# Patient Record
Sex: Female | Born: 1960 | Race: White | Hispanic: No | Marital: Single | Smoking: Current every day smoker
Health system: Southern US, Community
[De-identification: ages and names within clinical notes are randomized; demographics above are authoritative.]

## PROBLEM LIST (undated history)

## (undated) DIAGNOSIS — E079 Disorder of thyroid, unspecified: Secondary | ICD-10-CM

## (undated) DIAGNOSIS — Z803 Family history of malignant neoplasm of breast: Secondary | ICD-10-CM

## (undated) DIAGNOSIS — Z972 Presence of dental prosthetic device (complete) (partial): Secondary | ICD-10-CM

## (undated) DIAGNOSIS — T8859XA Other complications of anesthesia, initial encounter: Secondary | ICD-10-CM

## (undated) DIAGNOSIS — Z1379 Encounter for other screening for genetic and chromosomal anomalies: Secondary | ICD-10-CM

## (undated) DIAGNOSIS — M8580 Other specified disorders of bone density and structure, unspecified site: Secondary | ICD-10-CM

## (undated) DIAGNOSIS — N809 Endometriosis, unspecified: Secondary | ICD-10-CM

## (undated) DIAGNOSIS — M1990 Unspecified osteoarthritis, unspecified site: Secondary | ICD-10-CM

## (undated) DIAGNOSIS — R200 Anesthesia of skin: Secondary | ICD-10-CM

## (undated) DIAGNOSIS — Z9289 Personal history of other medical treatment: Secondary | ICD-10-CM

## (undated) DIAGNOSIS — T4145XA Adverse effect of unspecified anesthetic, initial encounter: Secondary | ICD-10-CM

## (undated) DIAGNOSIS — R202 Paresthesia of skin: Secondary | ICD-10-CM

## (undated) HISTORY — DX: Endometriosis, unspecified: N80.9

## (undated) HISTORY — DX: Family history of malignant neoplasm of breast: Z80.3

## (undated) HISTORY — DX: Disorder of thyroid, unspecified: E07.9

## (undated) HISTORY — DX: Other specified disorders of bone density and structure, unspecified site: M85.80

## (undated) HISTORY — PX: ABDOMINAL HYSTERECTOMY: SHX81

## (undated) HISTORY — PX: KNEE SURGERY: SHX244

## (undated) HISTORY — DX: Encounter for other screening for genetic and chromosomal anomalies: Z13.79

## (undated) HISTORY — DX: Personal history of other medical treatment: Z92.89

---

## 1989-10-01 HISTORY — PX: TUBAL LIGATION: SHX77

## 2006-03-27 ENCOUNTER — Ambulatory Visit: Payer: Self-pay | Admitting: Family Medicine

## 2006-10-17 ENCOUNTER — Ambulatory Visit: Payer: Self-pay

## 2009-02-25 ENCOUNTER — Ambulatory Visit: Payer: Self-pay | Admitting: Unknown Physician Specialty

## 2010-03-04 ENCOUNTER — Ambulatory Visit: Payer: Self-pay | Admitting: Unknown Physician Specialty

## 2010-03-25 ENCOUNTER — Ambulatory Visit: Payer: Self-pay | Admitting: Unknown Physician Specialty

## 2010-05-19 ENCOUNTER — Ambulatory Visit: Payer: Self-pay | Admitting: Unknown Physician Specialty

## 2010-05-21 ENCOUNTER — Ambulatory Visit: Payer: Self-pay | Admitting: General Surgery

## 2010-05-21 HISTORY — PX: COLONOSCOPY: SHX174

## 2010-05-25 LAB — PATHOLOGY REPORT

## 2011-08-31 ENCOUNTER — Ambulatory Visit: Payer: Self-pay | Admitting: Unknown Physician Specialty

## 2012-12-03 ENCOUNTER — Ambulatory Visit: Payer: Self-pay | Admitting: Orthopedic Surgery

## 2013-03-14 ENCOUNTER — Ambulatory Visit: Payer: Self-pay | Admitting: Orthopedic Surgery

## 2013-04-25 ENCOUNTER — Encounter: Payer: Self-pay | Admitting: *Deleted

## 2015-01-30 NOTE — Op Note (Signed)
PATIENT NAME:  Julie Boyer, Julie Boyer MR#:  [id number] DATE OF BIRTH:  1961/09/16  DATE OF PROCEDURE:  03/15/2013  PREOPERATIVE DIAGNOSES:  1.  Right knee osteoarthritis.  2.  Medial and lateral meniscus tears.  POSTOPERATIVE DIAGNOSES:   1.  Right knee osteoarthritis.  2.  Medial and lateral meniscus tears with loose body.   PROCEDURES: 1. Arthroscopy, right knee.  2.  Removal of loose body.  3.  Partial medial and lateral meniscectomy.   ANESTHESIA:  General.   SURGEON: Laurene Footman, MD  DESCRIPTION OF PROCEDURE: The patient was brought to the operating room and after adequate anesthesia was obtained, the right leg was placed in the arthroscopic legholder with a tourniquet applied, but not required. After prepping and draping in the usual sterile fashion, appropriate patient identification and timeout procedures were completed. There were prior arthroscopy portals and these were utilized inferomedial and inferolateral. Initial inspection revealed significant partial thickness loss to the entire undersurface of the patella as well as partial thickness cartilage loss in the femoral trochlea. Coming around medially, the inferomedial portal was opened and a probe introduced. There was a parrot-beak type tear at the junction of the middle and posterior thirds of the meniscus with the meniscus displaceable into the joint upon probing. There are grade 2 changes to the femoral and tibial condyles, but no exposed bone. The anterior cruciate ligament appeared intact with slight laxity. On examination of the lateral compartment, there was exposed bone on both femoral and tibial condyles especially posteriorly. The posterior meniscus had a complex tear involving most of the thickness of the meniscus. There was also loose body compartment, which was removed at this time, approximately 1 cm x 0.5 cm. The meniscal punch was used to debride the meniscus tears, followed by a shaver and then an ArthroCare wand to  get back to a stable margin, resecting approximately a quarter of the medial posterior horn and three quarters of the posterior horn of the lateral meniscus to get to a stable margin. The gutters were free of any loose bodies. The knee was irrigated until clear. All instrumentation was withdrawn. A single 4-0 nylon sutures were placed in each portal, 20 mL of 0.5% Sensorcaine was infiltrated into the area of the portals to aid in postoperative analgesia. Xeroform, 4 x 4's, Webril and Ace wrap were applied. The patient was sent to the recovery room in stable condition.   ESTIMATED BLOOD LOSS: Minimal.   COMPLICATIONS: None.   SPECIMEN: None.  Pre- and postprocedure pictures obtained.   ____________________________ Laurene Footman, MD mjm:cc D: 03/14/2013 21:55:27 ET T: 03/15/2013 00:10:23 ET JOB#: [id number]  cc: Laurene Footman, MD, <Dictator> Laurene Footman MD ELECTRONICALLY SIGNED 03/15/2013 19:09

## 2015-03-18 LAB — HM PAP SMEAR: HM PAP: NEGATIVE

## 2015-03-18 LAB — HM MAMMOGRAPHY

## 2015-04-02 ENCOUNTER — Other Ambulatory Visit: Payer: Self-pay

## 2015-04-02 ENCOUNTER — Ambulatory Visit (INDEPENDENT_AMBULATORY_CARE_PROVIDER_SITE_OTHER): Payer: BLUE CROSS/BLUE SHIELD | Admitting: Physician Assistant

## 2015-04-02 ENCOUNTER — Encounter: Payer: Self-pay | Admitting: Physician Assistant

## 2015-04-02 VITALS — BP 106/60 | HR 66 | Temp 98.2°F | Resp 16 | Ht 63.0 in | Wt 127.4 lb

## 2015-04-02 DIAGNOSIS — J349 Unspecified disorder of nose and nasal sinuses: Secondary | ICD-10-CM

## 2015-04-02 DIAGNOSIS — E538 Deficiency of other specified B group vitamins: Secondary | ICD-10-CM | POA: Diagnosis not present

## 2015-04-02 DIAGNOSIS — Z1211 Encounter for screening for malignant neoplasm of colon: Secondary | ICD-10-CM

## 2015-04-02 DIAGNOSIS — Z1389 Encounter for screening for other disorder: Secondary | ICD-10-CM | POA: Diagnosis not present

## 2015-04-02 DIAGNOSIS — Z0000 Encounter for general adult medical examination without abnormal findings: Secondary | ICD-10-CM

## 2015-04-02 DIAGNOSIS — Z720 Tobacco use: Secondary | ICD-10-CM | POA: Diagnosis not present

## 2015-04-02 DIAGNOSIS — Z1331 Encounter for screening for depression: Secondary | ICD-10-CM

## 2015-04-02 DIAGNOSIS — R5383 Other fatigue: Secondary | ICD-10-CM

## 2015-04-02 DIAGNOSIS — Z833 Family history of diabetes mellitus: Secondary | ICD-10-CM | POA: Diagnosis not present

## 2015-04-02 DIAGNOSIS — Z23 Encounter for immunization: Secondary | ICD-10-CM | POA: Diagnosis not present

## 2015-04-02 DIAGNOSIS — E039 Hypothyroidism, unspecified: Secondary | ICD-10-CM

## 2015-04-02 MED ORDER — BUPROPION HCL ER (SR) 150 MG PO TB12: 150.0000 mg | ORAL_TABLET | Freq: Two times a day (BID) | ORAL | Status: DC

## 2015-04-02 NOTE — Progress Notes (Signed)
Patient ID: Julie Boyer, female   DOB: [birth year], 54 y.o.   MRN: [id number]   Patient: Julie Boyer, Female    DOB: 1961/03/22, 54 y.o.   MRN: [id number] Visit Date: 04/02/2015  Today's Provider: Mar Daring, PA-C   Chief Complaint  Patient presents with  . Establish Care   Subjective:    Annual physical exam Julie Boyer is a 54 y.o. female who presents today for health maintenance and complete physical. She feels fairly well. She reports not exercising. She reports she is sleeping poorly, stating she awakes every couple of hours.  She lost her job last year and her brother passed last year as well from cancer.  She does have hypothyroidism and is followed by Dr. Sabino Niemann.  She also recently had her pap smear and mammogram 2 weeks ago.  The pap is self reported normal.  She has not received the mammogram result yet.  She also was tested for BRCA 1 and BRCA 2.  She has not received these results either.  She does report increased fatigue and just not feeling like herself since around May 2016.  Her TSH was checked in April and was WNL.    -----------------------------------------------------------------   Review of Systems  Constitutional: Positive for activity change and fatigue. Negative for fever, chills, appetite change and unexpected weight change.  HENT: Positive for congestion, postnasal drip, rhinorrhea, sinus pressure and sneezing. Negative for ear discharge, ear pain, hearing loss, mouth sores, sore throat, tinnitus, trouble swallowing and voice change.   Eyes: Negative for photophobia, pain, redness, itching and visual disturbance.  Respiratory: Negative for apnea, cough, chest tightness, shortness of breath and wheezing.   Cardiovascular: Negative for chest pain, palpitations and leg swelling.  Gastrointestinal: Positive for constipation. Negative for nausea, vomiting, abdominal pain, diarrhea, blood in stool and abdominal distention.  Endocrine: Positive  for polydipsia. Negative for cold intolerance, heat intolerance, polyphagia and polyuria.  Genitourinary: Negative for dysuria, frequency, hematuria, flank pain, vaginal bleeding, vaginal discharge, difficulty urinating, vaginal pain, menstrual problem and pelvic pain.  Musculoskeletal: Negative for myalgias, back pain, joint swelling, arthralgias, gait problem, neck pain and neck stiffness.  Skin: Negative for color change and rash.  Allergic/Immunologic: Positive for environmental allergies. Negative for food allergies and immunocompromised state.  Neurological: Positive for headaches. Negative for dizziness, tremors, seizures, syncope, facial asymmetry, speech difficulty, weakness, light-headedness and numbness.  Hematological: Negative for adenopathy. Does not bruise/bleed easily.  Psychiatric/Behavioral: Positive for decreased concentration. Negative for suicidal ideas, hallucinations, behavioral problems, confusion, sleep disturbance, self-injury, dysphoric mood and agitation. The patient is not nervous/anxious and is not hyperactive.     Social History She  reports that she has been smoking.  She has never used smokeless tobacco. She reports that she drinks alcohol. She reports that she does not use illicit drugs.  Patient Active Problem List   Diagnosis Date Noted  . Hypothyroidism 04/02/2015  . Sinus trouble 04/02/2015  . Fatigue 04/02/2015    Past Surgical History  Procedure Laterality Date  . Abdominal hysterectomy  1992  . Knee surgery Right 2011    Family History Her family history includes Alzheimer's disease in her mother; Bone cancer in her father; Breast cancer in her sister; Cancer in her brother; Diabetes in her mother; Lung cancer in her brother; Prostate cancer in her father.    Previous Medications   BIOTIN FORTE PO    Take by mouth daily.   CETIRIZINE (ZYRTEC) 10 MG TABLET    Take  1 tablet by mouth daily.   CYANOCOBALAMIN (VITAMIN B-12 PO)    Take by mouth  daily.   ESTRADIOL (ESTRACE) 0.5 MG TABLET    Take 1 tablet by mouth daily.   MISC NATURAL PRODUCTS (OSTEO BI-FLEX ADV DOUBLE ST PO)    Take by mouth daily.   MOMETASONE (NASONEX) 50 MCG/ACT NASAL SPRAY    Place 1 spray into the nose daily.   SYNTHROID 100 MCG TABLET    Take 1 tablet by mouth daily.    Patient Care Team: Mar Daring, PA-C as PCP - General (Physician Assistant)     Objective:   Vitals: BP 106/60 mmHg  Pulse 66  Temp(Src) 98.2 F (36.8 C) (Oral)  Resp 16  Ht _0  (1.6 m)  Wt 127 lb 6.4 oz (57.788 kg)  BMI 22.57 kg/m2   Physical Exam  Constitutional: She is oriented to person, place, and time. She appears well-developed and well-nourished. No distress.  HENT:  Head: Normocephalic and atraumatic.  Right Ear: Hearing, external ear and ear canal normal. A middle ear effusion (clear) is present.  Left Ear: Hearing, external ear and ear canal normal. A middle ear effusion (clear) is present.  Nose: Mucosal edema (left > right) and rhinorrhea present. No sinus tenderness. Right sinus exhibits maxillary sinus tenderness. Right sinus exhibits no frontal sinus tenderness. Left sinus exhibits maxillary sinus tenderness and frontal sinus tenderness.  Mouth/Throat: Uvula is midline, oropharynx is clear and moist and mucous membranes are normal. No oropharyngeal exudate.  Eyes: Conjunctivae are normal. Pupils are equal, round, and reactive to light. Right eye exhibits no discharge. Left eye exhibits no discharge. No scleral icterus.  Neck: Normal range of motion. Neck supple. Carotid bruit is not present. No tracheal deviation present. No thyromegaly present.  Cardiovascular: Normal rate, regular rhythm, normal heart sounds and intact distal pulses.  Exam reveals no gallop and no friction rub.   No murmur heard. Pulmonary/Chest: Effort normal and breath sounds normal. No respiratory distress. She has no wheezes. She has no rales. She exhibits no tenderness.  Abdominal:  Soft. Bowel sounds are normal. She exhibits no distension and no mass. There is no tenderness. There is no rebound and no guarding.  Genitourinary:  Deferred and completed 2 weeks ago by OB/GYN  Musculoskeletal: Normal range of motion. She exhibits no edema or tenderness.  Lymphadenopathy:    She has no cervical adenopathy.  Neurological: She is alert and oriented to person, place, and time. She has normal reflexes. No cranial nerve deficit.  Skin: Skin is warm. No rash noted. She is not diaphoretic. No erythema.  Psychiatric: She has a normal mood and affect. Her behavior is normal. Judgment and thought content normal.  Vitals reviewed.    Depression Screen PHQ 2/9 Scores 04/02/2015  PHQ - 2 Score 6  PHQ- 9 Score 14      Assessment & Plan:     Routine Health Maintenance and Physical Exam  Exercise Activities and Dietary recommendations Add exercise 30 minutes 3-4 days per week  Immunization History  Administered Date(s) Administered  . Tdap 04/02/2015    Health Maintenance  Topic Date Due  . HIV Screening  10/20/1974  . PAP SMEAR  04/09/2018  . TETANUS/TDAP  10/20/1978  . MAMMOGRAM  03/10/2016  . COLONOSCOPY  04/10/2015  . INFLUENZA VACCINE  05/11/2015      Discussed health benefits of physical activity, and encouraged her to engage in regular exercise appropriate for her age and condition.  1. Annual physical exam  - CBC w/Diff - Comprehensive Metabolic Panel (CMET) - Lipid Profile  2. Hypothyroidism, unspecified hypothyroidism type Stable on levothyroxine 120mg.  Followed by Dr. MSabino Niemann  3. Sinus trouble Currently on zyrtec and occasional use of nasonex.  Advised to use nasonex daily and add a decongestant for a short while to clear eustachian tubes as there was some clear fluid visible behind the TM bilaterally.  4. Other fatigue Seems related with depressed mood.  Will add Bupropion SR 1569mBID.  Will follow up in 4 weeks.  5. Need for Tdap  vaccination Tdap vaccine given.  6. Depression screen Scored 6 on PHQ2 and 14 on PHQ 9.  Will treat as below and follow up in 4 weeks. - buPROPion (WELLBUTRIN SR) 150 MG 12 hr tablet; Take 1 tablet (150 mg total) by mouth 2 (two) times daily.  Dispense: 30 tablet; Refill: 1  7. Tobacco abuse Discussed importance of tobacco cessation.  Will see if adding bupropion helps smoking cessation.  Offered information about [place] quit line, and online resources but she declined. - buPROPion (WELLBUTRIN SR) 150 MG 12 hr tablet; Take 1 tablet (150 mg total) by mouth 2 (two) times daily.  Dispense: 30 tablet; Refill: 1  8. Vitamin B 12 deficiency Will check labs and f/u pending labs.   - B12  9. Family history of diabetes mellitus (DM) Will check labs and f/u pending labs. - HgB A1c  10. Colon cancer screening Will refer to GI for colonoscopy.  She had a colonoscopy with Dr. SaJamal Collinhen she turned [id number] feels she was to have a 5 year follow up. - Ambulatory referral to Gastroenterology    --------------------------------------------------------------------

## 2015-04-02 NOTE — Patient Instructions (Addendum)
Health Maintenance Adopting a healthy lifestyle and getting preventive care can go a long way to promote health and wellness. Talk with your health care provider about what schedule of regular examinations is right for you. This is a good chance for you to check in with your provider about disease prevention and staying healthy. In between checkups, there are plenty of things you can do on your own. Experts have done a lot of research about which lifestyle changes and preventive measures are most likely to keep you healthy. Ask your health care provider for more information. WEIGHT AND DIET  Eat a healthy diet 1. Be sure to include plenty of vegetables, fruits, low-fat dairy products, and lean protein. 2. Do not eat a lot of foods high in solid fats, added sugars, or salt. 3. Get regular exercise. This is one of the most important things you can do for your health. 1. Most adults should exercise for at least 150 minutes each week. The exercise should increase your heart rate and make you sweat (moderate-intensity exercise). 2. Most adults should also do strengthening exercises at least twice a week. This is in addition to the moderate-intensity exercise.  Maintain a healthy weight  Body mass index (BMI) is a measurement that can be used to identify possible weight problems. It estimates body fat based on height and weight. Your health care provider can help determine your BMI and help you achieve or maintain a healthy weight.  For females 68 years of age and older:   A BMI below 18.5 is considered underweight.  A BMI of 18.5 to 24.9 is normal.  A BMI of 25 to 29.9 is considered overweight.  A BMI of 30 and above is considered obese.  Watch levels of cholesterol and blood lipids  You should start having your blood tested for lipids and cholesterol at 54 years of age, then have this test every 5 years.  You may need to have your cholesterol levels checked more often if:  Your lipid or  cholesterol levels are high.  You are older than 55 years of age.  You are at high risk for heart disease.  CANCER SCREENING   Lung Cancer  Lung cancer screening is recommended for adults 58-76 years old who are at high risk for lung cancer because of a history of smoking.  A yearly low-dose CT scan of the lungs is recommended for people who:  Currently smoke.  Have quit within the past 15 years.  Have at least a 30-pack-year history of smoking. A pack year is smoking an average of one pack of cigarettes a day for 1 year.  Yearly screening should continue until it has been 15 years since you quit.  Yearly screening should stop if you develop a health problem that would prevent you from having lung cancer treatment.  Breast Cancer  Practice breast self-awareness. This means understanding how your breasts normally appear and feel.  It also means doing regular breast self-exams. Let your health care provider know about any changes, no matter how small.  If you are in your 20s or 30s, you should have a clinical breast exam (CBE) by a health care provider every 1-3 years as part of a regular health exam.  If you are 52 or older, have a CBE every year. Also consider having a breast X-ray (mammogram) every year.  If you have a family history of breast cancer, talk to your health care provider about genetic screening.  If you are  at high risk for breast cancer, talk to your health care provider about having an MRI and a mammogram every year.  Breast cancer gene (BRCA) assessment is recommended for women who have family members with BRCA-related cancers. BRCA-related cancers include:  Breast.  Ovarian.  Tubal.  Peritoneal cancers.  Results of the assessment will determine the need for genetic counseling and BRCA1 and BRCA2 testing. Cervical Cancer Routine pelvic examinations to screen for cervical cancer are no longer recommended for nonpregnant women who are considered low  risk for cancer of the pelvic organs (ovaries, uterus, and vagina) and who do not have symptoms. A pelvic examination may be necessary if you have symptoms including those associated with pelvic infections. Ask your health care provider if a screening pelvic exam is right for you.   The Pap test is the screening test for cervical cancer for women who are considered at risk.  If you had a hysterectomy for a problem that was not cancer or a condition that could lead to cancer, then you no longer need Pap tests.  If you are older than 65 years, and you have had normal Pap tests for the past 10 years, you no longer need to have Pap tests.  If you have had past treatment for cervical cancer or a condition that could lead to cancer, you need Pap tests and screening for cancer for at least 20 years after your treatment.  If you no longer get a Pap test, assess your risk factors if they change (such as having a new sexual partner). This can affect whether you should start being screened again.  Some women have medical problems that increase their chance of getting cervical cancer. If this is the case for you, your health care provider may recommend more frequent screening and Pap tests.  The human papillomavirus (HPV) test is another test that may be used for cervical cancer screening. The HPV test looks for the virus that can cause cell changes in the cervix. The cells collected during the Pap test can be tested for HPV.  The HPV test can be used to screen women 30 years of age and older. Getting tested for HPV can extend the interval between normal Pap tests from three to five years.  An HPV test also should be used to screen women of any age who have unclear Pap test results.  After 55 years of age, women should have HPV testing as often as Pap tests.  Colorectal Cancer  This type of cancer can be detected and often prevented.  Routine colorectal cancer screening usually begins at 55 years of  age and continues through 55 years of age.  Your health care provider may recommend screening at an earlier age if you have risk factors for colon cancer.  Your health care provider may also recommend using home test kits to check for hidden blood in the stool.  A small camera at the end of a tube can be used to examine your colon directly (sigmoidoscopy or colonoscopy). This is done to check for the earliest forms of colorectal cancer.  Routine screening usually begins at age 50.  Direct examination of the colon should be repeated every 5-10 years through 55 years of age. However, you may need to be screened more often if early forms of precancerous polyps or small growths are found. Skin Cancer  Check your skin from head to toe regularly.  Tell your health care provider about any new moles or changes in   moles, especially if there is a change in a mole's shape or color.  Also tell your health care provider if you have a mole that is larger than the size of a pencil eraser.  Always use sunscreen. Apply sunscreen liberally and repeatedly throughout the day.  Protect yourself by wearing long sleeves, pants, a wide-brimmed hat, and sunglasses whenever you are outside. HEART DISEASE, DIABETES, AND HIGH BLOOD PRESSURE   Have your blood pressure checked at least every 1-2 years. High blood pressure causes heart disease and increases the risk of stroke.  If you are between 75 years and 42 years old, ask your health care provider if you should take aspirin to prevent strokes.  Have regular diabetes screenings. This involves taking a blood sample to check your fasting blood sugar level.  If you are at a normal weight and have a low risk for diabetes, have this test once every three years after 55 years of age.  If you are overweight and have a high risk for diabetes, consider being tested at a younger age or more often. PREVENTING INFECTION  Hepatitis B  If you have a higher risk for  hepatitis B, you should be screened for this virus. You are considered at high risk for hepatitis B if:  You were born in a country where hepatitis B is common. Ask your health care provider which countries are considered high risk.  Your parents were born in a high-risk country, and you have not been immunized against hepatitis B (hepatitis B vaccine).  You have HIV or AIDS.  You use needles to inject street drugs.  You live with someone who has hepatitis B.  You have had sex with someone who has hepatitis B.  You get hemodialysis treatment.  You take certain medicines for conditions, including cancer, organ transplantation, and autoimmune conditions. Hepatitis C  Blood testing is recommended for:  Everyone born from [birth year] through 1966.  Anyone with known risk factors for hepatitis C. Sexually transmitted infections (STIs)  You should be screened for sexually transmitted infections (STIs) including gonorrhea and chlamydia if:  You are sexually active and are younger than 55 years of age.  You are older than 55 years of age and your health care provider tells you that you are at risk for this type of infection.  Your sexual activity has changed since you were last screened and you are at an increased risk for chlamydia or gonorrhea. Ask your health care provider if you are at risk.  If you do not have HIV, but are at risk, it may be recommended that you take a prescription medicine daily to prevent HIV infection. This is called pre-exposure prophylaxis (PrEP). You are considered at risk if:  You are sexually active and do not regularly use condoms or know the HIV status of your partner(s).  You take drugs by injection.  You are sexually active with a partner who has HIV. Talk with your health care provider about whether you are at high risk of being infected with HIV. If you choose to begin PrEP, you should first be tested for HIV. You should then be tested every 3 months for  as long as you are taking PrEP.  PREGNANCY   If you are premenopausal and you may become pregnant, ask your health care provider about preconception counseling.  If you may become pregnant, take 400 to 800 micrograms (mcg) of folic acid every day.  If you want to prevent pregnancy, talk to your  health care provider about birth control (contraception). OSTEOPOROSIS AND MENOPAUSE   Osteoporosis is a disease in which the bones lose minerals and strength with aging. This can result in serious bone fractures. Your risk for osteoporosis can be identified using a bone density scan.  If you are 3 years of age or older, or if you are at risk for osteoporosis and fractures, ask your health care provider if you should be screened.  Ask your health care provider whether you should take a calcium or vitamin D supplement to lower your risk for osteoporosis.  Menopause may have certain physical symptoms and risks.  Hormone replacement therapy may reduce some of these symptoms and risks. Talk to your health care provider about whether hormone replacement therapy is right for you.  HOME CARE INSTRUCTIONS   Schedule regular health, dental, and eye exams.  Stay current with your immunizations.   Do not use any tobacco products including cigarettes, chewing tobacco, or electronic cigarettes.  If you are pregnant, do not drink alcohol.  If you are breastfeeding, limit how much and how often you drink alcohol.  Limit alcohol intake to no more than 1 drink per day for nonpregnant women. One drink equals 12 ounces of beer, 5 ounces of wine, or 1 ounces of hard liquor.  Do not use street drugs.  Do not share needles.  Ask your health care provider for help if you need support or information about quitting drugs.  Tell your health care provider if you often feel depressed.  Tell your health care provider if you have ever been abused or do not feel safe at home. Document Released: 04/11/2011  Document Revised: 02/10/2014 Document Reviewed: 08/28/2013 [facility] Patient Information 2015 [place], [place]. This information is not intended to replace advice given to you by your health care provider. Make sure you discuss any questions you have with your health care provider.  Depression Depression refers to feeling sad, low, down in the dumps, blue, gloomy, or empty. In general, there are two kinds of depression: 4. Normal sadness or normal grief. This kind of depression is one that we all feel from time to time after upsetting life experiences, such as the loss of a job or the ending of a relationship. This kind of depression is considered normal, is short lived, and resolves within a few days to 2 weeks. Depression experienced after the loss of a loved one (bereavement) often lasts longer than 2 weeks but normally gets better with time. 5. Clinical depression. This kind of depression lasts longer than normal sadness or normal grief or interferes with your ability to function at home, at work, and in school. It also interferes with your personal relationships. It affects almost every aspect of your life. Clinical depression is an illness. Symptoms of depression can also be caused by conditions other than those mentioned above, such as:  Physical illness. Some physical illnesses, including underactive thyroid gland (hypothyroidism), severe anemia, specific types of cancer, diabetes, uncontrolled seizures, heart and lung problems, strokes, and chronic pain are commonly associated with symptoms of depression.  Side effects of some prescription medicine. In some people, certain types of medicine can cause symptoms of depression.  Substance abuse. Abuse of alcohol and illicit drugs can cause symptoms of depression. SYMPTOMS Symptoms of normal sadness and normal grief include the following:  Feeling sad or crying for short periods of time.  Not caring about anything (apathy).  Difficulty  sleeping or sleeping too much.  No longer able to  enjoy the things you used to enjoy.  Desire to be by oneself all the time (social isolation).  Lack of energy or motivation.  Difficulty concentrating or remembering.  Change in appetite or weight.  Restlessness or agitation. Symptoms of clinical depression include the same symptoms of normal sadness or normal grief and also the following symptoms:  Feeling sad or crying all the time.  Feelings of guilt or worthlessness.  Feelings of hopelessness or helplessness.  Thoughts of suicide or the desire to harm yourself (suicidal ideation).  Loss of touch with reality (psychotic symptoms). Seeing or hearing things that are not real (hallucinations) or having false beliefs about your life or the people around you (delusions and paranoia). DIAGNOSIS  The diagnosis of clinical depression is usually based on how bad the symptoms are and how long they have lasted. Your health care provider will also ask you questions about your medical history and substance use to find out if physical illness, use of prescription medicine, or substance abuse is causing your depression. Your health care provider may also order blood tests. TREATMENT  Often, normal sadness and normal grief do not require treatment. However, sometimes antidepressant medicine is given for bereavement to ease the depressive symptoms until they resolve. The treatment for clinical depression depends on how bad the symptoms are but often includes antidepressant medicine, counseling with a mental health professional, or both. Your health care provider will help to determine what treatment is best for you. Depression caused by physical illness usually goes away with appropriate medical treatment of the illness. If prescription medicine is causing depression, talk with your health care provider about stopping the medicine, decreasing the dose, or changing to another medicine. Depression  caused by the abuse of alcohol or illicit drugs goes away when you stop using these substances. Some adults need professional help in order to stop drinking or using drugs. SEEK IMMEDIATE MEDICAL CARE IF:  You have thoughts about hurting yourself or others.  You lose touch with reality (have psychotic symptoms).  You are taking medicine for depression and have a serious side effect. FOR MORE INFORMATION  National [employer] on Mental Illness: www.nami.[employer]: https://carter.com/ Document Released: 09/23/2000 Document Revised: 02/10/2014 Document Reviewed: 12/26/2011 [facility] Patient Information 2015 [place], [place]. This information is not intended to replace advice given to you by your health care provider. Make sure you discuss any questions you have with your health care provider.

## 2015-04-03 ENCOUNTER — Telehealth: Payer: Self-pay

## 2015-04-03 LAB — CBC WITH DIFFERENTIAL/PLATELET
BASOS ABS: 0 10*3/uL (ref 0.0–0.2)
Basos: 0 %
EOS (ABSOLUTE): 0.1 10*3/uL (ref 0.0–0.4)
Eos: 1 %
Hematocrit: 39.3 % (ref 34.0–46.6)
Hemoglobin: 13.5 g/dL (ref 11.1–15.9)
IMMATURE GRANULOCYTES: 0 %
Immature Grans (Abs): 0 10*3/uL (ref 0.0–0.1)
Lymphocytes Absolute: 1.9 10*3/uL (ref 0.7–3.1)
Lymphs: 32 %
MCH: 32.2 pg (ref 26.6–33.0)
MCHC: 34.4 g/dL (ref 31.5–35.7)
MCV: 94 fL (ref 79–97)
MONOCYTES: 7 %
Monocytes Absolute: 0.4 10*3/uL (ref 0.1–0.9)
Neutrophils Absolute: 3.6 10*3/uL (ref 1.4–7.0)
Neutrophils: 60 %
Platelets: 191 10*3/uL (ref 150–379)
RBC: 4.19 x10E6/uL (ref 3.77–5.28)
RDW: 13.2 % (ref 12.3–15.4)
WBC: 6 10*3/uL (ref 3.4–10.8)

## 2015-04-03 LAB — HEMOGLOBIN A1C
ESTIMATED AVERAGE GLUCOSE: 105 mg/dL
HEMOGLOBIN A1C: 5.3 % (ref 4.8–5.6)

## 2015-04-03 LAB — COMPREHENSIVE METABOLIC PANEL
A/G RATIO: 2.3 (ref 1.1–2.5)
ALK PHOS: 63 IU/L (ref 39–117)
ALT: 15 IU/L (ref 0–32)
AST: 19 IU/L (ref 0–40)
Albumin: 4.4 g/dL (ref 3.5–5.5)
BILIRUBIN TOTAL: 0.8 mg/dL (ref 0.0–1.2)
BUN / CREAT RATIO: 16 (ref 9–23)
BUN: 8 mg/dL (ref 6–24)
CALCIUM: 9.3 mg/dL (ref 8.7–10.2)
CHLORIDE: 98 mmol/L (ref 97–108)
CO2: 23 mmol/L (ref 18–29)
Creatinine, Ser: 0.5 mg/dL — ABNORMAL LOW (ref 0.57–1.00)
GFR calc Af Amer: 126 mL/min/{1.73_m2} (ref >59)
GFR, EST NON AFRICAN AMERICAN: 109 mL/min/{1.73_m2} (ref >59)
GLUCOSE: 84 mg/dL (ref 65–99)
Globulin, Total: 1.9 g/dL (ref 1.5–4.5)
POTASSIUM: 4.7 mmol/L (ref 3.5–5.2)
SODIUM: 135 mmol/L (ref 134–144)
Total Protein: 6.3 g/dL (ref 6.0–8.5)

## 2015-04-03 LAB — LIPID PANEL
Chol/HDL Ratio: 2.4 ratio units (ref 0.0–4.4)
Cholesterol, Total: 194 mg/dL (ref 100–199)
HDL: 81 mg/dL (ref >39)
LDL Calculated: 97 mg/dL (ref 0–99)
Triglycerides: 79 mg/dL (ref 0–149)
VLDL CHOLESTEROL CAL: 16 mg/dL (ref 5–40)

## 2015-04-03 LAB — VITAMIN B12: Vitamin B-12: 1885 pg/mL — ABNORMAL HIGH (ref 211–946)

## 2015-04-03 NOTE — Telephone Encounter (Signed)
Pt is returning call.  CB#[phone]/[id number]

## 2015-04-03 NOTE — Telephone Encounter (Signed)
Patient advised as directed below. Patient verbalized understanding.  

## 2015-04-03 NOTE — Telephone Encounter (Signed)
-----   Message from Mar Daring, [place] sent at 04/03/2015  8:14 AM EDT ----- All labs are within normal limits and stable with exception of B12 which is high.  OK to discontinue B12 supplement or take every other day.  Thanks! -JB

## 2015-04-03 NOTE — Telephone Encounter (Signed)
LMTCB

## 2015-04-06 ENCOUNTER — Telehealth: Payer: Self-pay | Admitting: Gastroenterology

## 2015-04-06 NOTE — Telephone Encounter (Signed)
Received Gi referral Phoned pt LM to contact office

## 2015-04-22 ENCOUNTER — Other Ambulatory Visit: Payer: Self-pay

## 2015-04-22 ENCOUNTER — Telehealth: Payer: Self-pay | Admitting: Gastroenterology

## 2015-04-22 NOTE — Telephone Encounter (Signed)
Gastroenterology Pre-Procedure Review  Request Date: 05-21-2015 Requesting Physician: Dr. Allen Norris   PATIENT REVIEW QUESTIONS: The patient responded to the following health history questions as indicated:    1. Are you having any GI issues? no 2. Do you have a personal history of Polyps? yes (3-5 years ago colon) 3. Do you have a family history of Colon Cancer or Polyps? no 4. Diabetes Mellitus? no 5. Joint replacements in the past 12 months?no 6. Major health problems in the past 3 months?no 7. Any artificial heart valves, MVP, or defibrillator?no    MEDICATIONS & ALLERGIES:    Patient reports the following regarding taking any anticoagulation/antiplatelet therapy:   Plavix, Coumadin, Eliquis, Xarelto, Lovenox, Pradaxa, Brilinta, or Effient? no Aspirin? no  Patient confirms/reports the following medications:  Current Outpatient Prescriptions  Medication Sig Dispense Refill   BIOTIN FORTE PO Take by mouth daily.     buPROPion (WELLBUTRIN SR) 150 MG 12 hr tablet Take 1 tablet (150 mg total) by mouth 2 (two) times daily. 30 tablet 1   cetirizine (ZYRTEC) 10 MG tablet Take 1 tablet by mouth daily.     Cyanocobalamin (VITAMIN B-12 PO) Take by mouth daily.     estradiol (ESTRACE) 0.5 MG tablet Take 1 tablet by mouth daily.  11   Misc Natural Products (OSTEO BI-FLEX ADV DOUBLE ST PO) Take by mouth daily.     mometasone (NASONEX) 50 MCG/ACT nasal spray Place 1 spray into the nose daily.     SYNTHROID 100 MCG tablet Take 1 tablet by mouth daily.  11   No current facility-administered medications for this visit.    Patient confirms/reports the following allergies:  Allergies  Allergen Reactions   Azithromycin Other (See Comments)    Z-Pak: Red splotches    No orders of the defined types were placed in this encounter.    AUTHORIZATION INFORMATION Primary Insurance: 1D#: Group #:  Secondary Insurance: 1D#: Group #:  SCHEDULE INFORMATION: Date:  05-21-2015   Time: Location:[facility]

## 2015-05-01 ENCOUNTER — Encounter: Payer: Self-pay | Admitting: Physician Assistant

## 2015-05-01 ENCOUNTER — Ambulatory Visit (INDEPENDENT_AMBULATORY_CARE_PROVIDER_SITE_OTHER): Payer: BLUE CROSS/BLUE SHIELD | Admitting: Physician Assistant

## 2015-05-01 VITALS — BP 90/60 | HR 69 | Temp 97.6°F | Resp 16 | Wt 126.6 lb

## 2015-05-01 DIAGNOSIS — F329 Major depressive disorder, single episode, unspecified: Secondary | ICD-10-CM

## 2015-05-01 DIAGNOSIS — F32A Depression, unspecified: Secondary | ICD-10-CM

## 2015-05-01 DIAGNOSIS — Z720 Tobacco use: Secondary | ICD-10-CM | POA: Diagnosis not present

## 2015-05-01 MED ORDER — BUPROPION HCL ER (SR) 150 MG PO TB12: 150.0000 mg | ORAL_TABLET | Freq: Two times a day (BID) | ORAL | Status: DC

## 2015-05-01 NOTE — Patient Instructions (Signed)
Smoking Cessation Quitting smoking is important to your health and has many advantages. However, it is not always easy to quit since nicotine is a very addictive drug. Oftentimes, people try 3 times or more before being able to quit. This document explains the best ways for you to prepare to quit smoking. Quitting takes hard work and a lot of effort, but you can do it. ADVANTAGES OF QUITTING SMOKING  You will live longer, feel better, and live better.  Your body will feel the impact of quitting smoking almost immediately.  Within 20 minutes, blood pressure decreases. Your pulse returns to its normal level.  After 8 hours, carbon monoxide levels in the blood return to normal. Your oxygen level increases.  After 24 hours, the chance of having a heart attack starts to decrease. Your breath, hair, and body stop smelling like smoke.  After 48 hours, damaged nerve endings begin to recover. Your sense of taste and smell improve.  After 72 hours, the body is virtually free of nicotine. Your bronchial tubes relax and breathing becomes easier.  After 2 to 12 weeks, lungs can hold more air. Exercise becomes easier and circulation improves.  The risk of having a heart attack, stroke, cancer, or lung disease is greatly reduced.  After 1 year, the risk of coronary heart disease is cut in half.  After 5 years, the risk of stroke falls to the same as a nonsmoker.  After 10 years, the risk of lung cancer is cut in half and the risk of other cancers decreases significantly.  After 15 years, the risk of coronary heart disease drops, usually to the level of a nonsmoker.  If you are pregnant, quitting smoking will improve your chances of having a healthy baby.  The people you live with, especially any children, will be healthier.  You will have extra money to spend on things other than cigarettes. QUESTIONS TO THINK ABOUT BEFORE ATTEMPTING TO QUIT You may want to talk about your answers with your  health care provider.  Why do you want to quit?  If you tried to quit in the past, what helped and what did not?  What will be the most difficult situations for you after you quit? How will you plan to handle them?  Who can help you through the tough times? Your family? Friends? A health care provider?  What pleasures do you get from smoking? What ways can you still get pleasure if you quit? Here are some questions to ask your health care provider:  How can you help me to be successful at quitting?  What medicine do you think would be best for me and how should I take it?  What should I do if I need more help?  What is smoking withdrawal like? How can I get information on withdrawal? GET READY  Set a quit date.  Change your environment by getting rid of all cigarettes, ashtrays, matches, and lighters in your home, car, or work. Do not let people smoke in your home.  Review your past attempts to quit. Think about what worked and what did not. GET SUPPORT AND ENCOURAGEMENT You have a better chance of being successful if you have help. You can get support in many ways.  Tell your family, friends, and coworkers that you are going to quit and need their support. Ask them not to smoke around you.  Get individual, group, or telephone counseling and support. Programs are available at local hospitals and health centers. Call   your local health department for information about programs in your area.  Spiritual beliefs and practices may help some smokers quit.  Download a "quit meter" on your computer to keep track of quit statistics, such as how long you have gone without smoking, cigarettes not smoked, and money saved.  Get a self-help book about quitting smoking and staying off tobacco. [place] yourself from urges to smoke. Talk to someone, go for a walk, or occupy your time with a task.  Change your normal routine. Take a different route to work.  Drink tea instead of coffee. Eat breakfast in a different place.  Reduce your stress. Take a hot bath, exercise, or read a book.  Plan something enjoyable to do every day. Reward yourself for not smoking.  Explore interactive web-based programs that specialize in helping you quit. GET MEDICINE AND USE IT CORRECTLY Medicines can help you stop smoking and decrease the urge to smoke. Combining medicine with the above behavioral methods and support can greatly increase your chances of successfully quitting smoking.  Nicotine replacement therapy helps deliver nicotine to your body without the negative effects and risks of smoking. Nicotine replacement therapy includes nicotine gum, lozenges, inhalers, nasal sprays, and skin patches. Some may be available over-the-counter and others require a prescription.  Antidepressant medicine helps people abstain from smoking, but how this works is unknown. This medicine is available by prescription.  Nicotinic receptor partial agonist medicine simulates the effect of nicotine in your brain. This medicine is available by prescription. Ask your health care provider for advice about which medicines to use and how to use them based on your health history. Your health care provider will tell you what side effects to look out for if you choose to be on a medicine or therapy. Carefully read the information on the package. Do not use any other product containing nicotine while using a nicotine replacement product.  RELAPSE OR DIFFICULT SITUATIONS Most relapses occur within the first 3 months after quitting. Do not be discouraged if you start smoking again. Remember, most people try several times before finally quitting. You may have symptoms of withdrawal because your body is used to nicotine. You may crave cigarettes, be irritable, feel very hungry, cough often, get headaches, or have difficulty concentrating. The withdrawal symptoms are only temporary. They are strongest  when you first quit, but they will go away within 10-14 days. To reduce the chances of relapse, try to:  Avoid drinking alcohol. Drinking lowers your chances of successfully quitting.  Reduce the amount of caffeine you consume. Once you quit smoking, the amount of caffeine in your body increases and can give you symptoms, such as a rapid heartbeat, sweating, and anxiety.  Avoid smokers because they can make you want to smoke.  Do not let weight gain distract you. Many smokers will gain weight when they quit, usually less than 10 pounds. Eat a healthy diet and stay active. You can always lose the weight gained after you quit.  Find ways to improve your mood other than smoking. FOR MORE INFORMATION  www.smokefree.gov  Document Released: 09/20/2001 Document Revised: 02/10/2014 Document Reviewed: 01/05/2012 [facility] Patient Information 2015 [place], [place]. This information is not intended to replace advice given to you by your health care provider. Make sure you discuss any questions you have with your health care provider. Depression Depression refers to feeling sad, low, down in the dumps, blue, gloomy, or empty. In general, there are two kinds of  depression:  Normal sadness or normal grief. This kind of depression is one that we all feel from time to time after upsetting life experiences, such as the loss of a job or the ending of a relationship. This kind of depression is considered normal, is short lived, and resolves within a few days to 2 weeks. Depression experienced after the loss of a loved one (bereavement) often lasts longer than 2 weeks but normally gets better with time.  Clinical depression. This kind of depression lasts longer than normal sadness or normal grief or interferes with your ability to function at home, at work, and in school. It also interferes with your personal relationships. It affects almost every aspect of your life. Clinical depression is an illness. Symptoms of  depression can also be caused by conditions other than those mentioned above, such as:  Physical illness. Some physical illnesses, including underactive thyroid gland (hypothyroidism), severe anemia, specific types of cancer, diabetes, uncontrolled seizures, heart and lung problems, strokes, and chronic pain are commonly associated with symptoms of depression.  Side effects of some prescription medicine. In some people, certain types of medicine can cause symptoms of depression.  Substance abuse. Abuse of alcohol and illicit drugs can cause symptoms of depression. SYMPTOMS Symptoms of normal sadness and normal grief include the following:  Feeling sad or crying for short periods of time.  Not caring about anything (apathy).  Difficulty sleeping or sleeping too much.  No longer able to enjoy the things you used to enjoy.  Desire to be by oneself all the time (social isolation).  Lack of energy or motivation.  Difficulty concentrating or remembering.  Change in appetite or weight.  Restlessness or agitation. Symptoms of clinical depression include the same symptoms of normal sadness or normal grief and also the following symptoms:  Feeling sad or crying all the time.  Feelings of guilt or worthlessness.  Feelings of hopelessness or helplessness.  Thoughts of suicide or the desire to harm yourself (suicidal ideation).  Loss of touch with reality (psychotic symptoms). Seeing or hearing things that are not real (hallucinations) or having false beliefs about your life or the people around you (delusions and paranoia). DIAGNOSIS  The diagnosis of clinical depression is usually based on how bad the symptoms are and how long they have lasted. Your health care provider will also ask you questions about your medical history and substance use to find out if physical illness, use of prescription medicine, or substance abuse is causing your depression. Your health care provider may also  order blood tests. TREATMENT  Often, normal sadness and normal grief do not require treatment. However, sometimes antidepressant medicine is given for bereavement to ease the depressive symptoms until they resolve. The treatment for clinical depression depends on how bad the symptoms are but often includes antidepressant medicine, counseling with a mental health professional, or both. Your health care provider will help to determine what treatment is best for you. Depression caused by physical illness usually goes away with appropriate medical treatment of the illness. If prescription medicine is causing depression, talk with your health care provider about stopping the medicine, decreasing the dose, or changing to another medicine. Depression caused by the abuse of alcohol or illicit drugs goes away when you stop using these substances. Some adults need professional help in order to stop drinking or using drugs. SEEK IMMEDIATE MEDICAL CARE IF:  You have thoughts about hurting yourself or others.  You lose touch with reality (have psychotic symptoms).  You are taking medicine for depression and have a serious side effect. FOR MORE INFORMATION  National Alliance on Mental Illness: www.nami.[employer]: https://carter.com/ Document Released: 09/23/2000 Document Revised: 02/10/2014 Document Reviewed: 12/26/2011 [facility] Patient Information 2015 [place], [place]. This information is not intended to replace advice given to you by your health care provider. Make sure you discuss any questions you have with your health care provider.

## 2015-05-01 NOTE — Progress Notes (Signed)
Patient: Julie Boyer Female    DOB: 1960-12-03   54 y.o.   MRN: [id number] Visit Date: 05/01/2015  Today's Provider: Mar Daring, PA-C   Chief Complaint  Patient presents with  . Follow-up    on Wellbutrin   Subjective:    HPI Julie Boyer is a 54 year old female that returns to the office today for follow-up on her depression. At last visit she was started on Wellbutrin 150 mg to titrate up to 2 pills daily. She states she has been doing well and is compliant with the medication. She does feel there has been some improvement but feels that it is only minimal. She would like to continue with the treatment of Wellbutrin for now and reevaluate to see if this medication is working. She did have one episode of moodiness at initiation of the medication, but this has subsided.  She denies any depressed mood, insomnia, hallucinations, elated moods, tearfulness, suicidal ideations, or homicidal ideations.    Allergies  Allergen Reactions  . Azithromycin Other (See Comments)    Z-Pak: Red splotches   Previous Medications   BIOTIN FORTE PO    Take by mouth daily.   BUPROPION (WELLBUTRIN SR) 150 MG 12 HR TABLET    Take 1 tablet (150 mg total) by mouth 2 (two) times daily.   CETIRIZINE (ZYRTEC) 10 MG TABLET    Take 1 tablet by mouth daily.   CYANOCOBALAMIN (VITAMIN B-12 PO)    Take by mouth daily.   ESTRADIOL (ESTRACE) 0.5 MG TABLET    Take 1 tablet by mouth daily.   MISC NATURAL PRODUCTS (OSTEO BI-FLEX ADV DOUBLE ST PO)    Take by mouth daily.   MOMETASONE (NASONEX) 50 MCG/ACT NASAL SPRAY    Place 1 spray into the nose daily.   SYNTHROID 100 MCG TABLET    Take 1 tablet by mouth daily.    Review of Systems  Constitutional: Negative for fatigue.  Neurological: Negative for dizziness, seizures, syncope, weakness, numbness and headaches.  Psychiatric/Behavioral: Negative for suicidal ideas, hallucinations, behavioral problems, confusion, sleep disturbance, self-injury,  dysphoric mood, decreased concentration and agitation. The patient is not nervous/anxious and is not hyperactive.     History  Substance Use Topics  . Smoking status: Current Every Day Smoker -- 1.00 packs/day for 20 years  . Smokeless tobacco: Never Used  . Alcohol Use: 0.0 oz/week    0 Standard drinks or equivalent per week     Comment: OCCASIONALLY   Objective:   BP 90/60 mmHg  Pulse 69  Temp(Src) 97.6 F (36.4 C) (Oral)  Resp 16  Wt 126 lb 9.6 oz (57.425 kg)  Physical Exam  Constitutional: She appears well-developed and well-nourished. No distress.  Cardiovascular: Normal rate, regular rhythm and normal heart sounds.  Exam reveals no gallop and no friction rub.   No murmur heard. Pulmonary/Chest: Effort normal and breath sounds normal. No respiratory distress. She has no wheezes. She has no rales. She exhibits no tenderness.  Neurological: She is alert.  Skin: She is not diaphoretic.  Psychiatric: She has a normal mood and affect. Her behavior is normal. Judgment and thought content normal.  Vitals reviewed.       Assessment & Plan:     1. Depression Will continue Wellbutrin 150 mg to take twice daily as below. Will follow-up in 3 months. She is to call the office in the meantime if she has any worsening symptoms. - buPROPion (WELLBUTRIN SR) 150 MG  12 hr tablet; Take 1 tablet (150 mg total) by mouth 2 (two) times daily.  Dispense: 60 tablet; Refill: 3  2. Tobacco abuse She does continue to smoke cigarettes. She does not wish to quit at this time. She does however mention that the Wellbutrin does make the cigarettes tastes bad and she feels she is not smoking as much as she used to.       Mar Daring, PA-C  [facility]

## 2015-05-15 ENCOUNTER — Telehealth: Payer: Self-pay | Admitting: Physician Assistant

## 2015-05-15 NOTE — Telephone Encounter (Signed)
[facility] [place] told her she needed to get auth for the prep kit for the colonoscopy before they could fil it and her colon is on Thursday.  Can you check with them.   Thanks, [name]

## 2015-05-15 NOTE — Telephone Encounter (Signed)
GI should do this.

## 2015-05-15 NOTE — Telephone Encounter (Signed)
Called patient an dleft her a message that her GI dr. Should take care of her getting the prep kit for the colonoscopy.  Thanks, tp

## 2015-05-18 ENCOUNTER — Encounter: Payer: Self-pay | Admitting: *Deleted

## 2015-05-18 ENCOUNTER — Encounter: Payer: Self-pay | Admitting: Gastroenterology

## 2015-05-19 ENCOUNTER — Telehealth: Payer: Self-pay | Admitting: Physician Assistant

## 2015-05-19 DIAGNOSIS — F329 Major depressive disorder, single episode, unspecified: Secondary | ICD-10-CM

## 2015-05-19 DIAGNOSIS — F32A Depression, unspecified: Secondary | ICD-10-CM

## 2015-05-19 MED ORDER — BUPROPION HCL ER (SR) 150 MG PO TB12: 150.0000 mg | ORAL_TABLET | Freq: Two times a day (BID) | ORAL | Status: DC

## 2015-05-19 NOTE — Discharge Instructions (Signed)

## 2015-05-19 NOTE — Telephone Encounter (Signed)
Patient advised RX has been sent to the pharmacy.

## 2015-05-19 NOTE — Telephone Encounter (Signed)
Pt states she called the pharmacy and they informed her she has no refill on her med.'s pt would like to get refill on the following [facility] [place] CB# [phone]. CC   buPROPion [facility] SR) 150 MG 12 hr tablet 05/01/15 -- Mar Daring, PA-C Take 1 tablet (150 mg total) by mouth 2 (two) times daily.

## 2015-05-19 NOTE — Telephone Encounter (Signed)
[facility]

## 2015-05-21 ENCOUNTER — Ambulatory Visit: Payer: BLUE CROSS/BLUE SHIELD | Admitting: Anesthesiology

## 2015-05-21 ENCOUNTER — Ambulatory Visit
Admission: RE | Admit: 2015-05-21 | Discharge: 2015-05-21 | Disposition: A | Discharge status: Home / Self Care | Payer: BLUE CROSS/BLUE SHIELD | Source: Ambulatory Visit | Attending: Gastroenterology | Admitting: Gastroenterology

## 2015-05-21 ENCOUNTER — Other Ambulatory Visit: Payer: Self-pay | Admitting: Gastroenterology

## 2015-05-21 ENCOUNTER — Encounter
Admission: RE | Disposition: A | Discharge status: Home / Self Care | Payer: Self-pay | Source: Ambulatory Visit | Attending: Gastroenterology

## 2015-05-21 DIAGNOSIS — Z86010 Personal history of colonic polyps: Secondary | ICD-10-CM | POA: Insufficient documentation

## 2015-05-21 DIAGNOSIS — F17210 Nicotine dependence, cigarettes, uncomplicated: Secondary | ICD-10-CM | POA: Diagnosis not present

## 2015-05-21 DIAGNOSIS — Z79899 Other long term (current) drug therapy: Secondary | ICD-10-CM | POA: Diagnosis not present

## 2015-05-21 DIAGNOSIS — D120 Benign neoplasm of cecum: Secondary | ICD-10-CM | POA: Diagnosis not present

## 2015-05-21 DIAGNOSIS — F329 Major depressive disorder, single episode, unspecified: Secondary | ICD-10-CM | POA: Insufficient documentation

## 2015-05-21 DIAGNOSIS — M1990 Unspecified osteoarthritis, unspecified site: Secondary | ICD-10-CM | POA: Diagnosis not present

## 2015-05-21 DIAGNOSIS — E039 Hypothyroidism, unspecified: Secondary | ICD-10-CM | POA: Insufficient documentation

## 2015-05-21 HISTORY — DX: Anesthesia of skin: R20.0

## 2015-05-21 HISTORY — DX: Paresthesia of skin: R20.2

## 2015-05-21 HISTORY — PX: POLYPECTOMY: SHX5525

## 2015-05-21 HISTORY — PX: COLONOSCOPY WITH PROPOFOL: SHX5780

## 2015-05-21 HISTORY — DX: Other complications of anesthesia, initial encounter: T88.59XA

## 2015-05-21 HISTORY — DX: Adverse effect of unspecified anesthetic, initial encounter: T41.45XA

## 2015-05-21 HISTORY — DX: Presence of dental prosthetic device (complete) (partial): Z97.2

## 2015-05-21 HISTORY — DX: Unspecified osteoarthritis, unspecified site: M19.90

## 2015-05-21 SURGERY — COLONOSCOPY WITH PROPOFOL
Anesthesia: Monitor Anesthesia Care | Wound class: Contaminated

## 2015-05-21 MED ORDER — ONDANSETRON HCL 4 MG/2ML IJ SOLN: 4.0000 mg | Freq: Once | INTRAMUSCULAR | Status: DC | PRN

## 2015-05-21 MED ORDER — ACETAMINOPHEN 160 MG/5ML PO SOLN: 325.0000 mg | ORAL | Status: DC | PRN

## 2015-05-21 MED ORDER — ACETAMINOPHEN 325 MG PO TABS: 325.0000 mg | ORAL_TABLET | ORAL | Status: DC | PRN

## 2015-05-21 MED ORDER — LIDOCAINE HCL (CARDIAC) 20 MG/ML IV SOLN
INTRAVENOUS | Status: DC | PRN
  Administered 2015-05-21: 10 mg via INTRAVENOUS

## 2015-05-21 MED ORDER — PROPOFOL 10 MG/ML IV BOLUS
INTRAVENOUS | Status: DC | PRN
  Administered 2015-05-21 (×11): 20 mg via INTRAVENOUS

## 2015-05-21 MED ORDER — STERILE WATER FOR IRRIGATION IR SOLN
Status: DC | PRN
  Administered 2015-05-21: 08:00:00

## 2015-05-21 MED ORDER — SODIUM CHLORIDE 0.9 % IV SOLN: INTRAVENOUS | Status: DC

## 2015-05-21 MED ORDER — LACTATED RINGERS IV SOLN
INTRAVENOUS | Status: DC
  Administered 2015-05-21: 08:00:00 via INTRAVENOUS

## 2015-05-21 MED ORDER — SODIUM CHLORIDE 0.9 % IJ SOLN
INTRAMUSCULAR | Status: DC | PRN
  Administered 2015-05-21: 1 mL

## 2015-05-21 SURGICAL SUPPLY — 28 items
CANISTER SUCT 1200ML W/VALVE (MISCELLANEOUS) ×3 IMPLANT
FCP ESCP3.2XJMB 240X2.8X (MISCELLANEOUS)
FORCEPS BIOP RAD 4 LRG CAP 4 (CUTTING FORCEPS) IMPLANT
FORCEPS BIOP RJ4 240 W/NDL (MISCELLANEOUS)
FORCEPS ESCP3.2XJMB 240X2.8X (MISCELLANEOUS) IMPLANT
GOWN CVR UNV OPN BCK APRN NK (MISCELLANEOUS) ×4 IMPLANT
GOWN ISOL THUMB LOOP REG UNIV (MISCELLANEOUS) ×2
HEMOCLIP INSTINCT (CLIP) ×6 IMPLANT
INJECTOR VARIJECT VIN23 (MISCELLANEOUS) ×2 IMPLANT
KIT CO2 TUBING (TUBING) IMPLANT
KIT DEFENDO VALVE AND CONN (KITS) IMPLANT
KIT ENDO PROCEDURE OLY (KITS) ×3 IMPLANT
LIGATOR MULTIBAND 6SHOOTER MBL (MISCELLANEOUS) IMPLANT
MARKER SPOT ENDO TATTOO 5ML (MISCELLANEOUS) IMPLANT
PAD GROUND ADULT SPLIT (MISCELLANEOUS) ×3 IMPLANT
SNARE SHORT THROW 13M SML OVAL (MISCELLANEOUS) ×3 IMPLANT
SNARE SHORT THROW 30M LRG OVAL (MISCELLANEOUS) IMPLANT
SPOT EX ENDOSCOPIC TATTOO (MISCELLANEOUS)
SUCTION POLY TRAP 4CHAMBER (MISCELLANEOUS) IMPLANT
TRAP SUCTION POLY (MISCELLANEOUS) ×3 IMPLANT
TUBING CONN 6MMX3.1M (TUBING)
TUBING SUCTION CONN 0.25 STRL (TUBING) IMPLANT
UNDERPAD 30X60 958B10 (PK) (MISCELLANEOUS) IMPLANT
VALVE BIOPSY ENDO (VALVE) IMPLANT
VARIJECT INJECTOR VIN23 (MISCELLANEOUS) ×3
WATER AUXILLARY (MISCELLANEOUS) IMPLANT
WATER STERILE IRR 250ML POUR (IV SOLUTION) ×3 IMPLANT
WATER STERILE IRR 500ML POUR (IV SOLUTION) IMPLANT

## 2015-05-21 NOTE — Transfer of Care (Signed)
Immediate Anesthesia Transfer of Care Note  Patient: Julie Boyer  Procedure(s) Performed: Procedure(s): COLONOSCOPY WITH PROPOFOL (N/A) POLYPECTOMY  Patient Location: PACU  Anesthesia Type: MAC  Level of Consciousness: awake, alert  and patient cooperative  Airway and Oxygen Therapy: Patient Spontanous Breathing and Patient connected to supplemental oxygen  Post-op Assessment: Post-op Vital signs reviewed, Patient's Cardiovascular Status Stable, Respiratory Function Stable, Patent Airway and No signs of Nausea or vomiting  Post-op Vital Signs: Reviewed and stable  Complications: No apparent anesthesia complications

## 2015-05-21 NOTE — H&P (Signed)
[facility]  [address], [place], [place] [address] Phone: [phone] Fax : [phone]  Primary Care Physician:  Mar Daring, PA-C Primary Gastroenterologist:  Dr. Allen Norris  Pre-Procedure History & Physical: HPI:  Julie Boyer is a 54 y.o. female is here for an colonoscopy.   Past Medical History  Diagnosis Date  . Thyroid disorder   . Wears dentures     partial lower  . Complication of anesthesia     sneezing after last colonoscopy  . Numbness and tingling in hands     pt thinks may be Carpal Tunnel issues  . Arthritis     hands    Past Surgical History  Procedure Laterality Date  . Abdominal hysterectomy  1992  . Knee surgery Right 2011  . Colonoscopy      Prior to Admission medications   Medication Sig Start Date End Date Taking? Authorizing Provider  BIOTIN FORTE PO Take by mouth daily.   Yes Historical Provider, MD  buPROPion (WELLBUTRIN SR) 150 MG 12 hr tablet Take 1 tablet (150 mg total) by mouth 2 (two) times daily. 05/19/15  Yes Clearnce Sorrel Burnette, PA-C  cetirizine (ZYRTEC) 10 MG tablet Take 1 tablet by mouth daily.   Yes Historical Provider, MD  Cyanocobalamin (VITAMIN B-12 PO) Take by mouth daily.   Yes Historical Provider, MD  estradiol (ESTRACE) 0.5 MG tablet Take 1 tablet by mouth daily. 03/18/15  Yes Historical Provider, MD  Misc Natural Products (OSTEO BI-FLEX ADV DOUBLE ST PO) Take by mouth daily.   Yes Historical Provider, MD  mometasone (NASONEX) 50 MCG/ACT nasal spray Place 1 spray into the nose daily.   Yes Historical Provider, MD  SYNTHROID 100 MCG tablet Take 1 tablet by mouth daily. 03/17/15  Yes Historical Provider, MD    Allergies as of 04/22/2015 - Review Complete 04/02/2015  Allergen Reaction Noted  . Azithromycin Other (See Comments) 04/02/2015    Family History  Problem Relation Age of Onset  . Prostate cancer Father   . Bone cancer Father   . Breast cancer Sister   . Diabetes Mother   . Alzheimer's  disease Mother   . Cancer Brother   . Lung cancer Brother     Social History   Social History  . Marital Status: Divorced    Spouse Name: N/A  . Number of Children: N/A  . Years of Education: N/A   Occupational History  . Not on file.   Social History Main Topics  . Smoking status: Current Every Day Smoker -- 1.00 packs/day for 20 years  . Smokeless tobacco: Never Used  . Alcohol Use: 3.6 oz/week    0 Standard drinks or equivalent, 6 Shots of liquor per week     Comment: OCCASIONALLY  . Drug Use: No  . Sexual Activity: Not on file   Other Topics Concern  . Not on file   Social History Narrative    Review of Systems: See HPI, otherwise negative ROS  Physical Exam: Ht 5\' 3"  (1.6 m)  Wt 126 lb (57.153 kg)  BMI 22.33 kg/m2 General:   Alert,  pleasant and cooperative in NAD Head:  Normocephalic and atraumatic. Neck:  Supple; no masses or thyromegaly. Lungs:  Clear throughout to auscultation.    Heart:  Regular rate and rhythm. Abdomen:  Soft, nontender and nondistended. Normal bowel sounds, without guarding, and without rebound.   Neurologic:  Alert and  oriented x4;  grossly normal neurologically.  Impression/Plan: [name]  is here for an colonoscopy to be performed for history of colon polyp.  Risks, benefits, limitations, and alternatives regarding  colonoscopy have been reviewed with the patient.  Questions have been answered.  All parties agreeable.   Ollen Bowl, MD  05/21/2015, 7:21 AM

## 2015-05-21 NOTE — Anesthesia Preprocedure Evaluation (Signed)
Anesthesia Evaluation  Patient identified by MRN, date of birth, ID band Patient awake    Reviewed: Allergy & Precautions, NPO status , Patient's Chart, lab work & pertinent test results  Airway Mallampati: II  TM Distance: >3 FB Neck ROM: Full    Dental   Pulmonary Current Smoker,    Pulmonary exam normal       Cardiovascular Normal cardiovascular exam    Neuro/Psych PSYCHIATRIC DISORDERS Depression    GI/Hepatic   Endo/Other  Hypothyroidism   Renal/GU      Musculoskeletal  (+) Arthritis -,   Abdominal   Peds  Hematology   Anesthesia Other Findings   Reproductive/Obstetrics                             Anesthesia Physical Anesthesia Plan  ASA: II  Anesthesia Plan: MAC   Post-op Pain Management:    Induction: Intravenous  Airway Management Planned: Nasal Cannula  Additional Equipment:   Intra-op Plan:   Post-operative Plan:   Informed Consent: I have reviewed the patients History and Physical, chart, labs and discussed the procedure including the risks, benefits and alternatives for the proposed anesthesia with the patient or authorized representative who has indicated his/her understanding and acceptance.     Plan Discussed with: CRNA  Anesthesia Plan Comments:         Anesthesia Quick Evaluation

## 2015-05-21 NOTE — Anesthesia Postprocedure Evaluation (Signed)
  Anesthesia Post-op Note  Patient: Julie Boyer  Procedure(s) Performed: Procedure(s): COLONOSCOPY WITH PROPOFOL (N/A) POLYPECTOMY  Anesthesia type:MAC  Patient location: PACU  Post pain: Pain level controlled  Post assessment: Post-op Vital signs reviewed, Patient's Cardiovascular Status Stable, Respiratory Function Stable, Patent Airway and No signs of Nausea or vomiting  Post vital signs: Reviewed and stable  Last Vitals:  Filed Vitals:   05/21/15 0828  BP:   Temp: 36.6 C  Resp:     Level of consciousness: awake, alert  and patient cooperative  Complications: No apparent anesthesia complications

## 2015-05-21 NOTE — Op Note (Signed)
[facility] Patient Name: Julie Boyer Procedure Date: 05/21/2015 7:54 AM MRN: [id number] Account #: [id number] Date of Birth: May 28, 1961 Admit Type: Outpatient Age: 54 Room: [facility] OR ROOM 01 Gender: Female Note Status: Finalized Procedure:         Colonoscopy Indications:       High risk colon cancer surveillance: Personal history of                     colonic polyps Providers:         [name], MD Medicines:         Propofol per Anesthesia Complications:     No immediate complications. Procedure:         Pre-Anesthesia Assessment:                    - Prior to the procedure, a History and Physical was                     performed, and patient medications and allergies were                     reviewed. The patient's tolerance of previous anesthesia                     was also reviewed. The risks and benefits of the procedure                     and the sedation options and risks were discussed with the                     patient. All questions were answered, and informed consent                     was obtained. Prior Anticoagulants: The patient has taken                     no previous anticoagulant or antiplatelet agents. ASA                     Grade Assessment: II - A patient with mild systemic                     disease. After reviewing the risks and benefits, the                     patient was deemed in satisfactory condition to undergo                     the procedure.                    After obtaining informed consent, the colonoscope was                     passed under direct vision. Throughout the procedure, the                     patient's blood pressure, pulse, and oxygen saturations                     were monitored continuously. The Olympus CF H180AL                     colonoscope (S#: [id number]) was introduced through the anus  and advanced to the the cecum, identified by appendiceal        orifice and ileocecal valve. The colonoscopy was performed                     without difficulty. The patient tolerated the procedure                     well. The quality of the bowel preparation was excellent. Findings:      The perianal and digital rectal examinations were normal.      A 7 mm polyp was found in the cecum. The polyp was sessile. Area was       successfully injected with 4 mL saline for a lift polypectomy. The polyp       was removed with a cold snare. Resection and retrieval were complete. To       prevent bleeding after the polypectomy, one hemostatic clip was       successfully placed (MRI compatible). There was no bleeding at the end       of the procedure.      A 8 mm polyp was found in the cecum. The polyp was sessile. The polyp       was removed with a cold snare. Resection and retrieval were complete. To       prevent bleeding after the polypectomy, one hemostatic clip was       successfully placed (MRI compatible). There was no bleeding at the end       of the procedure. Impression:        - One 7 mm polyp in the cecum. Resected and retrieved.                     Injected. Clip was placed.                    - One 8 mm polyp in the cecum. Resected and retrieved.                     Clip was placed. Recommendation:    - Await pathology results.                    - Repeat colonoscopy in 5 years for surveillance. Procedure Code(s): --- Professional ---                    [phone], Colonoscopy, flexible; with removal of tumor(s),                     polyp(s), or other lesion(s) by snare technique                    45381, Colonoscopy, flexible; with directed submucosal                     injection(s), any substance Diagnosis Code(s): --- Professional ---                    Z86.010, Personal history of colonic polyps                    D12.0, Benign neoplasm of cecum CPT copyright 2014 American Medical Association. All rights reserved. The codes documented in  this report are preliminary and upon coder review may  be revised to meet current compliance requirements. [name], MD 05/21/2015 8:28:41 AM This report  has been signed electronically. Number of Addenda: 0 Note Initiated On: 05/21/2015 7:54 AM Scope Withdrawal Time: 0 hours 17 minutes 41 seconds  Total Procedure Duration: 0 hours 22 minutes 58 seconds       [facility]

## 2015-05-22 ENCOUNTER — Encounter: Payer: Self-pay | Admitting: Gastroenterology

## 2015-06-01 ENCOUNTER — Encounter: Payer: Self-pay | Admitting: Gastroenterology

## 2015-07-01 NOTE — Telephone Encounter (Signed)
error 

## 2015-07-11 DIAGNOSIS — M8580 Other specified disorders of bone density and structure, unspecified site: Secondary | ICD-10-CM

## 2015-07-11 HISTORY — DX: Other specified disorders of bone density and structure, unspecified site: M85.80

## 2015-08-07 ENCOUNTER — Ambulatory Visit (INDEPENDENT_AMBULATORY_CARE_PROVIDER_SITE_OTHER): Payer: BLUE CROSS/BLUE SHIELD | Admitting: Physician Assistant

## 2015-08-07 ENCOUNTER — Encounter: Payer: Self-pay | Admitting: Physician Assistant

## 2015-08-07 VITALS — BP 98/60 | HR 73 | Temp 97.9°F | Resp 16 | Wt 128.2 lb

## 2015-08-07 DIAGNOSIS — F329 Major depressive disorder, single episode, unspecified: Secondary | ICD-10-CM | POA: Diagnosis not present

## 2015-08-07 DIAGNOSIS — F32A Depression, unspecified: Secondary | ICD-10-CM

## 2015-08-07 MED ORDER — ESCITALOPRAM OXALATE 10 MG PO TABS: ORAL_TABLET | ORAL | Status: DC

## 2015-08-07 NOTE — Patient Instructions (Signed)
Major Depressive Disorder Major depressive disorder is a mental illness. It also may be called clinical depression or unipolar depression. Major depressive disorder usually causes feelings of sadness, hopelessness, or helplessness. Some people with this disorder do not feel particularly sad but lose interest in doing things they used to enjoy (anhedonia). Major depressive disorder also can cause physical symptoms. It can interfere with work, school, relationships, and other normal everyday activities. The disorder varies in severity but is longer lasting and more serious than the sadness we all feel from time to time in our lives. Major depressive disorder often is triggered by stressful life events or major life changes. Examples of these triggers include divorce, loss of your job or home, a move, and the death of a family member or close friend. Sometimes this disorder occurs for no obvious reason at all. People who have family members with major depressive disorder or bipolar disorder are at higher risk for developing this disorder, with or without life stressors. Major depressive disorder can occur at any age. It may occur just once in your life (single episode major depressive disorder). It may occur multiple times (recurrent major depressive disorder). SYMPTOMS People with major depressive disorder have either anhedonia or depressed mood on nearly a daily basis for at least 2 weeks or longer. Symptoms of depressed mood include:  Feelings of sadness (blue or down in the dumps) or emptiness.  Feelings of hopelessness or helplessness.  Tearfulness or episodes of crying (may be observed by others).  Irritability (children and adolescents). In addition to depressed mood or anhedonia or both, people with this disorder have at least four of the following symptoms:  Difficulty sleeping or sleeping too much.   Significant change (increase or decrease) in appetite or weight.   Lack of energy or  motivation.  Feelings of guilt and worthlessness.   Difficulty concentrating, remembering, or making decisions.  Unusually slow movement (psychomotor retardation) or restlessness (as observed by others).   Recurrent wishes for death, recurrent thoughts of self-harm (suicide), or a suicide attempt. People with major depressive disorder commonly have persistent negative thoughts about themselves, other people, and the world. People with severe major depressive disorder may experiencedistorted beliefs or perceptions about the world (psychotic delusions). They also may see or hear things that are not real (psychotic hallucinations). DIAGNOSIS Major depressive disorder is diagnosed through an assessment by your health care provider. Your health care provider will ask aboutaspects of your daily life, such as mood,sleep, and appetite, to see if you have the diagnostic symptoms of major depressive disorder. Your health care provider may ask about your medical history and use of alcohol or drugs, including prescription medicines. Your health care provider also may do a physical exam and blood work. This is because certain medical conditions and the use of certain substances can cause major depressive disorder-like symptoms (secondary depression). Your health care provider also may refer you to a mental health specialist for further evaluation and treatment. TREATMENT It is important to recognize the symptoms of major depressive disorder and seek treatment. The following treatments can be prescribed for this disorder:   Medicine. Antidepressant medicines usually are prescribed. Antidepressant medicines are thought to correct chemical imbalances in the brain that are commonly associated with major depressive disorder. Other types of medicine may be added if the symptoms do not respond to antidepressant medicines alone or if psychotic delusions or hallucinations occur.  Talk therapy. Talk therapy can be  helpful in treating major depressive disorder by providing   support, education, and guidance. Certain types of talk therapy also can help with negative thinking (cognitive behavioral therapy) and with relationship issues that trigger this disorder (interpersonal therapy). A mental health specialist can help determine which treatment is best for you. Most people with major depressive disorder do well with a combination of medicine and talk therapy. Treatments involving electrical stimulation of the brain can be used in situations with extremely severe symptoms or when medicine and talk therapy do not work over time. These treatments include electroconvulsive therapy, transcranial magnetic stimulation, and vagal nerve stimulation.   This information is not intended to replace advice given to you by your health care provider. Make sure you discuss any questions you have with your health care provider.   Document Released: 01/21/2013 Document Revised: 10/17/2014 Document Reviewed: 01/21/2013 Elsevier Interactive Patient Education 2016 [employer].  Escitalopram tablets What is this medicine? ESCITALOPRAM (es sye TAL oh pram) is used to treat depression and certain types of anxiety. This medicine may be used for other purposes; ask your health care provider or pharmacist if you have questions. What should I tell my health care provider before I take this medicine? They need to know if you have any of these conditions: -bipolar disorder or a family history of bipolar disorder -diabetes -glaucoma -heart disease -kidney or liver disease -receiving electroconvulsive therapy -seizures (convulsions) -suicidal thoughts, plans, or attempt by you or a family member -an unusual or allergic reaction to escitalopram, the related drug citalopram, other medicines, foods, dyes, or preservatives -pregnant or trying to become pregnant -breast-feeding How should I use this medicine? Take this medicine by mouth  with a glass of water. Follow the directions on the prescription label. You can take it with or without food. If it upsets your stomach, take it with food. Take your medicine at regular intervals. Do not take it more often than directed. Do not stop taking this medicine suddenly except upon the advice of your doctor. Stopping this medicine too quickly may cause serious side effects or your condition may worsen. A special MedGuide will be given to you by the pharmacist with each prescription and refill. Be sure to read this information carefully each time. Talk to your pediatrician regarding the use of this medicine in children. Special care may be needed. Overdosage: If you think you have taken too much of this medicine contact a poison control center or emergency room at once. NOTE: This medicine is only for you. Do not share this medicine with others. What if I miss a dose? If you miss a dose, take it as soon as you can. If it is almost time for your next dose, take only that dose. Do not take double or extra doses. What may interact with this medicine? Do not take this medicine with any of the following medications: -certain medicines for fungal infections like fluconazole, itraconazole, ketoconazole, posaconazole, voriconazole -cisapride -citalopram -dofetilide -dronedarone -linezolid -MAOIs like Carbex, Eldepryl, Marplan, Nardil, and Parnate -methylene blue (injected into a vein) -pimozide -thioridazine -ziprasidone This medicine may also interact with the following medications: -alcohol -aspirin and aspirin-like medicines -carbamazepine -certain medicines for depression, anxiety, or psychotic disturbances -certain medicines for migraine headache like almotriptan, eletriptan, frovatriptan, naratriptan, rizatriptan, sumatriptan, zolmitriptan -certain medicines for sleep -certain medicines that treat or prevent blood clots like warfarin, enoxaparin,  dalteparin -cimetidine -diuretics -fentanyl -furazolidone -isoniazid -lithium -metoprolol -NSAIDs, medicines for pain and inflammation, like ibuprofen or naproxen -other medicines that prolong the QT interval (cause an abnormal heart rhythm) -  procarbazine -rasagiline -supplements like St. John's wort, kava kava, valerian -tramadol -tryptophan This list may not describe all possible interactions. Give your health care provider a list of all the medicines, herbs, non-prescription drugs, or dietary supplements you use. Also tell them if you smoke, drink alcohol, or use illegal drugs. Some items may interact with your medicine. What should I watch for while using this medicine? Tell your doctor if your symptoms do not get better or if they get worse. Visit your doctor or health care professional for regular checks on your progress. Because it may take several weeks to see the full effects of this medicine, it is important to continue your treatment as prescribed by your doctor. Patients and their families should watch out for new or worsening thoughts of suicide or depression. Also watch out for sudden changes in feelings such as feeling anxious, agitated, panicky, irritable, hostile, aggressive, impulsive, severely restless, overly excited and hyperactive, or not being able to sleep. If this happens, especially at the beginning of treatment or after a change in dose, call your health care professional. Dennis Bast may get drowsy or dizzy. Do not drive, use machinery, or do anything that needs mental alertness until you know how this medicine affects you. Do not stand or sit up quickly, especially if you are an older patient. This reduces the risk of dizzy or fainting spells. Alcohol may interfere with the effect of this medicine. Avoid alcoholic drinks. Your mouth may get dry. Chewing sugarless gum or sucking hard candy, and drinking plenty of water may help. Contact your doctor if the problem does not go  away or is severe. What side effects may I notice from receiving this medicine? Side effects that you should report to your doctor or health care professional as soon as possible: -allergic reactions like skin rash, itching or hives, swelling of the face, lips, or tongue -confusion -feeling faint or lightheaded, falls -fast talking and excited feelings or actions that are out of control -hallucination, loss of contact with reality -seizures -suicidal thoughts or other mood changes -unusual bleeding or bruising Side effects that usually do not require medical attention (report to your doctor or health care professional if they continue or are bothersome): -blurred vision -changes in appetite -change in sex drive or performance -headache -increased sweating -nausea This list may not describe all possible side effects. Call your doctor for medical advice about side effects. You may report side effects to FDA at 1-800-FDA-1088. Where should I keep my medicine? Keep out of reach of children. Store at room temperature between 15 and 30 degrees C (59 and 86 degrees F). Throw away any unused medicine after the expiration date. NOTE: This sheet is a summary. It may not cover all possible information. If you have questions about this medicine, talk to your doctor, pharmacist, or health care provider.    2016, Elsevier/Gold Standard. (2013-04-23 12:32:55)

## 2015-08-07 NOTE — Progress Notes (Signed)
Patient: Julie Boyer Female    DOB: July 14, 1961   54 y.o.   MRN: [id number] Visit Date: 08/07/2015  Today's Provider: Mar Daring, PA-C   Chief Complaint  Patient presents with  . Follow-up    Depression   Subjective:    Depression        Chronicity: Patient is here for three month follow up.  The problem occurs constantly.  The problem has been gradually worsening (some) since onset.  Associated symptoms include decreased concentration, fatigue, irritable and appetite change (eating more).  Associated symptoms include no headaches and no suicidal ideas.     Exacerbated by: With the news of the colonoscopy results.   Treatments tried: Patient is on Wellbutrin.  Compliance with treatment is good. Patient is concern also her colonoscopy results, got the colonoscopy 05/2015. Per patient it showed a pre cancer cells and was recommended to get a repeat in 3 years for further evaluation.     Allergies  Allergen Reactions  . Azithromycin Other (See Comments)    Z-Pak: Red splotches   Previous Medications   BIOTIN FORTE PO    Take by mouth daily.   BUPROPION (WELLBUTRIN SR) 150 MG 12 HR TABLET    Take 1 tablet (150 mg total) by mouth 2 (two) times daily.   CETIRIZINE (ZYRTEC) 10 MG TABLET    Take 1 tablet by mouth daily.   CYANOCOBALAMIN (VITAMIN B-12 PO)    Take by mouth daily.   ESTRADIOL (ESTRACE) 0.5 MG TABLET    Take 1 tablet by mouth daily.   MISC NATURAL PRODUCTS (OSTEO BI-FLEX ADV DOUBLE ST PO)    Take by mouth daily.   MOMETASONE (NASONEX) 50 MCG/ACT NASAL SPRAY    Place 1 spray into the nose daily.   SYNTHROID 100 MCG TABLET    Take 1 tablet by mouth daily.    Review of Systems  Constitutional: Positive for appetite change (eating more) and fatigue.  Respiratory: Negative.   Cardiovascular: Negative.   Gastrointestinal: Negative.   Endocrine: Negative.   Musculoskeletal: Negative.   Neurological: Negative for dizziness, light-headedness and headaches.    Psychiatric/Behavioral: Positive for depression, sleep disturbance and decreased concentration. Negative for suicidal ideas.    Social History  Substance Use Topics  . Smoking status: Current Every Day Smoker -- 1.00 packs/day for 20 years  . Smokeless tobacco: Never Used  . Alcohol Use: 3.6 oz/week    0 Standard drinks or equivalent, 6 Shots of liquor per week     Comment: OCCASIONALLY   Objective:   BP 98/60 mmHg  Pulse 73  Temp(Src) 97.9 F (36.6 C) (Oral)  Resp 16  Wt 128 lb 3.2 oz (58.151 kg)  Physical Exam  Constitutional: She is oriented to person, place, and time. She appears well-developed and well-nourished. She is irritable. No distress.  Cardiovascular: Normal rate, regular rhythm and normal heart sounds.  Exam reveals no gallop and no friction rub.   No murmur heard. Pulmonary/Chest: Effort normal and breath sounds normal. No respiratory distress. She has no wheezes. She has no rales.  Neurological: She is alert and oriented to person, place, and time.  Skin: She is not diaphoretic.  Psychiatric: Her speech is normal and behavior is normal. Judgment and thought content normal. Her mood appears not anxious. Her affect is not angry and not inappropriate. Cognition and memory are normal. She exhibits a depressed mood.  Vitals reviewed.       Assessment &  Plan:     1. Depression She has not seen much improvement with the Wellbutrin. She still continues to have episodes of depression and irritability. She states that recently with the colonoscopy results that also has increased her anxiety and depression. She states she has not been sleeping well even with the medication. She also has been worried because she did have 1 brother that passed away from cancer (he had multiple forms of cancer and she thinks that he passed away from stomach cancer) and also has a brother currently undergoing cancer treatment. I did discuss with her in detail the colonoscopy results and that  things that we will be looking out for that would lead [place] to get a colonoscopy sooner than 3 years. She voiced understanding. I will change her Wellbutrin to Lexapro. Lexapro was prescribed as below. I will see her back in 4 weeks to see how she is doing with this new treatment. She is to call the office if she has any worsening symptoms or side effects to the medication. - escitalopram (LEXAPRO) 10 MG tablet; Take 1/2 tab PO q h.s. X 1 week, then take 1 tab PO q h.s.  Dispense: 30 tablet; Refill: Jacobus, PA-C  [facility]

## 2015-09-07 ENCOUNTER — Ambulatory Visit (INDEPENDENT_AMBULATORY_CARE_PROVIDER_SITE_OTHER): Payer: BLUE CROSS/BLUE SHIELD | Admitting: Physician Assistant

## 2015-09-07 ENCOUNTER — Encounter: Payer: Self-pay | Admitting: Physician Assistant

## 2015-09-07 VITALS — BP 90/60 | HR 61 | Temp 97.9°F | Resp 16 | Wt 131.4 lb

## 2015-09-07 DIAGNOSIS — G5601 Carpal tunnel syndrome, right upper limb: Secondary | ICD-10-CM

## 2015-09-07 DIAGNOSIS — F329 Major depressive disorder, single episode, unspecified: Secondary | ICD-10-CM

## 2015-09-07 DIAGNOSIS — F32A Depression, unspecified: Secondary | ICD-10-CM

## 2015-09-07 MED ORDER — ESCITALOPRAM OXALATE 5 MG PO TABS: 5.0000 mg | ORAL_TABLET | Freq: Every day | ORAL | Status: DC

## 2015-09-07 NOTE — Patient Instructions (Addendum)
Major Depressive Disorder Major depressive disorder is a mental illness. It also may be called clinical depression or unipolar depression. Major depressive disorder usually causes feelings of sadness, hopelessness, or helplessness. Some people with this disorder do not feel particularly sad but lose interest in doing things they used to enjoy (anhedonia). Major depressive disorder also can cause physical symptoms. It can interfere with work, school, relationships, and other normal everyday activities. The disorder varies in severity but is longer lasting and more serious than the sadness we all feel from time to time in our lives. Major depressive disorder often is triggered by stressful life events or major life changes. Examples of these triggers include divorce, loss of your job or home, a move, and the death of a family member or close friend. Sometimes this disorder occurs for no obvious reason at all. People who have family members with major depressive disorder or bipolar disorder are at higher risk for developing this disorder, with or without life stressors. Major depressive disorder can occur at any age. It may occur just once in your life (single episode major depressive disorder). It may occur multiple times (recurrent major depressive disorder). SYMPTOMS People with major depressive disorder have either anhedonia or depressed mood on nearly a daily basis for at least 2 weeks or longer. Symptoms of depressed mood include:  Feelings of sadness (blue or down in the dumps) or emptiness.  Feelings of hopelessness or helplessness.  Tearfulness or episodes of crying (may be observed by others).  Irritability (children and adolescents). In addition to depressed mood or anhedonia or both, people with this disorder have at least four of the following symptoms:  Difficulty sleeping or sleeping too much.   Significant change (increase or decrease) in appetite or weight.   Lack of energy or  motivation.  Feelings of guilt and worthlessness.   Difficulty concentrating, remembering, or making decisions.  Unusually slow movement (psychomotor retardation) or restlessness (as observed by others).   Recurrent wishes for death, recurrent thoughts of self-harm (suicide), or a suicide attempt. People with major depressive disorder commonly have persistent negative thoughts about themselves, other people, and the world. People with severe major depressive disorder may experiencedistorted beliefs or perceptions about the world (psychotic delusions). They also may see or hear things that are not real (psychotic hallucinations). DIAGNOSIS Major depressive disorder is diagnosed through an assessment by your health care provider. Your health care provider will ask aboutaspects of your daily life, such as mood,sleep, and appetite, to see if you have the diagnostic symptoms of major depressive disorder. Your health care provider may ask about your medical history and use of alcohol or drugs, including prescription medicines. Your health care provider also may do a physical exam and blood work. This is because certain medical conditions and the use of certain substances can cause major depressive disorder-like symptoms (secondary depression). Your health care provider also may refer you to a mental health specialist for further evaluation and treatment. TREATMENT It is important to recognize the symptoms of major depressive disorder and seek treatment. The following treatments can be prescribed for this disorder:   Medicine. Antidepressant medicines usually are prescribed. Antidepressant medicines are thought to correct chemical imbalances in the brain that are commonly associated with major depressive disorder. Other types of medicine may be added if the symptoms do not respond to antidepressant medicines alone or if psychotic delusions or hallucinations occur.  Talk therapy. Talk therapy can be  helpful in treating major depressive disorder by providing   support, education, and guidance. Certain types of talk therapy also can help with negative thinking (cognitive behavioral therapy) and with relationship issues that trigger this disorder (interpersonal therapy). A mental health specialist can help determine which treatment is best for you. Most people with major depressive disorder do well with a combination of medicine and talk therapy. Treatments involving electrical stimulation of the brain can be used in situations with extremely severe symptoms or when medicine and talk therapy do not work over time. These treatments include electroconvulsive therapy, transcranial magnetic stimulation, and vagal nerve stimulation.   This information is not intended to replace advice given to you by your health care provider. Make sure you discuss any questions you have with your health care provider.   Document Released: 01/21/2013 Document Revised: 10/17/2014 Document Reviewed: 01/21/2013 Elsevier Interactive Patient Education 2016 [place] Syndrome Carpal tunnel syndrome is a condition that causes pain in your hand and arm. The carpal tunnel is a narrow area located on the palm side of your wrist. Repeated wrist motion or certain diseases may cause swelling within the tunnel. This swelling pinches the main nerve in the wrist (median nerve). CAUSES  This condition may be caused by:   Repeated wrist motions.  Wrist injuries.  Arthritis.  A cyst or tumor in the carpal tunnel.  Fluid buildup during pregnancy. Sometimes the cause of this condition is not known.  RISK FACTORS This condition is more likely to develop in:   People who have jobs that cause them to repeatedly move their wrists in the same motion, such as butchers and cashiers.  Women.  People with certain conditions, such as:  Diabetes.  Obesity.  An underactive thyroid (hypothyroidism).  Kidney  failure. SYMPTOMS  Symptoms of this condition include:   A tingling feeling in your fingers, especially in your thumb, index, and middle fingers.  Tingling or numbness in your hand.  An aching feeling in your entire arm, especially when your wrist and elbow are bent for long periods of time.  Wrist pain that goes up your arm to your shoulder.  Pain that goes down into your palm or fingers.  A weak feeling in your hands. You may have trouble grabbing and holding items. Your symptoms may feel worse during the night.  DIAGNOSIS  This condition is diagnosed with a medical history and physical exam. You may also have tests, including:   An electromyogram (EMG). This test measures electrical signals sent by your nerves into the muscles.  X-rays. TREATMENT  Treatment for this condition includes:  Lifestyle changes. It is important to stop doing or modify the activity that caused your condition.  Physical or occupational therapy.  Medicines for pain and inflammation. This may include medicine that is injected into your wrist.  A wrist splint.  Surgery. HOME CARE INSTRUCTIONS  If You Have a Splint:  Wear it as told by your health care provider. Remove it only as told by your health care provider.  Loosen the splint if your fingers become numb and tingle, or if they turn cold and blue.  Keep the splint clean and dry. General Instructions  Take over-the-counter and prescription medicines only as told by your health care provider.  Rest your wrist from any activity that may be causing your pain. If your condition is work related, talk to your employer about changes that can be made, such as getting a wrist pad to use while typing.  If directed, apply ice to the painful  area:  Put ice in a plastic bag.  Place a towel between your skin and the bag.  Leave the ice on for 20 minutes, 2-3 times per day.  Keep all follow-up visits as told by your health care provider. This is  important.  Do any exercises as told by your health care provider, physical therapist, or occupational therapist. Basile IF:   You have new symptoms.  Your pain is not controlled with medicines.  Your symptoms get worse.   This information is not intended to replace advice given to you by your health care provider. Make sure you discuss any questions you have with your health care provider.   Document Released: 09/23/2000 Document Revised: 06/17/2015 Document Reviewed: 02/11/2015 Elsevier Interactive Patient Education [employer].

## 2015-09-07 NOTE — Progress Notes (Signed)
Patient: Julie Boyer Female    DOB: [birth year]   54 y.o.   MRN: [id number] Visit Date: 09/07/2015  Today's Provider: Mar Daring, PA-C   Chief Complaint  Patient presents with  . Follow-up    Depression   Subjective:    HPI Depression: Patient here for follow-up depression. She complains of fatigue and more tense. Onset was approximately 1 month ago, sometimes feels good ans sometimes feels worst since that time.  She denies current suicidal and homicidal plan or intent.   Family history significant for depression. Risk factors: none Previous treatment includes Wellbutrin and now Lexapro. She complains of the following side effects from the treatment: dry mouth.  She states that she felt she did better on the 5 mg Lexapro. The 10 mg Lexapro she feels makes her too drowsy the following morning.  She also has complaints of numbness in her hands bilaterally. The right hand bothers her more than her left hand. She states that she gets numbness in the thumb ring finger and middle finger. Occasionally it will involve the whole hand. She states this most often occurs in the middle of the night and awakes her from sleep. She also will wake up in the morning with her hands feeling numb. She states she has to shake her hands to try to get the numbness out. This has been going on for a while now. She does work with her hands a lot with her work. She has never been worked up for carpal tunnel. She does have wrist braces.    Allergies  Allergen Reactions  . Azithromycin Other (See Comments)    Z-Pak: Red splotches   Previous Medications   BIOTIN FORTE PO    Take by mouth daily.   CETIRIZINE (ZYRTEC) 10 MG TABLET    Take 1 tablet by mouth daily.   CYANOCOBALAMIN (VITAMIN B-12 PO)    Take by mouth daily.   ESCITALOPRAM (LEXAPRO) 10 MG TABLET    Take 1/2 tab PO q h.s. X 1 week, then take 1 tab PO q h.s.   ESTRADIOL (ESTRACE) 0.5 MG TABLET    Take 1 tablet by mouth daily.   MISC  NATURAL PRODUCTS (OSTEO BI-FLEX ADV DOUBLE ST PO)    Take by mouth daily.   MOMETASONE (NASONEX) 50 MCG/ACT NASAL SPRAY    Place 1 spray into the nose daily.   SYNTHROID 100 MCG TABLET    Take 1 tablet by mouth daily.    Review of Systems  Constitutional: Positive for fatigue. Negative for fever, chills and unexpected weight change.  HENT: Negative.   Respiratory: Negative.   Cardiovascular: Negative.   Gastrointestinal: Negative.   Musculoskeletal: Positive for arthralgias (does have arthritis in hands and knees).  Neurological: Positive for numbness (hands bilaterally in the morning).  Psychiatric/Behavioral: Positive for decreased concentration (depending on the day). The patient is nervous/anxious.     Social History  Substance Use Topics  . Smoking status: Current Every Day Smoker -- 1.00 packs/day for 20 years  . Smokeless tobacco: Never Used  . Alcohol Use: 3.6 oz/week    0 Standard drinks or equivalent, 6 Shots of liquor per week     Comment: OCCASIONALLY   Objective:   BP 90/60 mmHg  Pulse 61  Temp(Src) 97.9 F (36.6 C) (Oral)  Resp 16  Wt 131 lb 6.4 oz (59.603 kg)  Physical Exam  Constitutional: She appears well-developed and well-nourished. No distress.  Cardiovascular: Normal  rate, regular rhythm and normal heart sounds.  Exam reveals no gallop and no friction rub.   No murmur heard. Pulmonary/Chest: Effort normal and breath sounds normal. No respiratory distress. She has no wheezes. She has no rales.  Musculoskeletal:       Right wrist: She exhibits normal range of motion, no tenderness, no bony tenderness and no swelling.       Left wrist: She exhibits normal range of motion, no tenderness, no bony tenderness and no swelling.       Arms: Arthritic changes noted in joints of fingers bilaterally.  Skin: She is not diaphoretic.  Psychiatric: She has a normal mood and affect. Her behavior is normal. Judgment and thought content normal.  Vitals reviewed.         Assessment & Plan:     1. Depression Stated she felt better when she took the 5 mg Lexapro instead of the 10 mg Lexapro. I will step her back down to the 5 mg and this was refilled as below. I will see her back in 4 weeks to see how she is doing with the 5 mg Lexapro to make sure that it is managing her depression symptoms. She is to call the office if her symptoms do not improve or worsen in the meantime. - escitalopram (LEXAPRO) 5 MG tablet; Take 1 tablet (5 mg total) by mouth at bedtime.  Dispense: 30 tablet; Refill: 1  2. Carpal tunnel syndrome of right wrist Seems to have carpal tunnel bilaterally but with the right worse than the left. She does work with her hands often in her job. She states that the numbness is worse overnight and will wake her up and also takes several a while to get this sensation back in her hands in the morning when she wakes up. She would like to start with conservative therapy prior to referral for orthopedics for further evaluation. We will start with night splints and anti-inflammatories as needed. I will see her back in 4 weeks to reevaluate how she is doing with conservative therapy. If no improvement at that time may consider referral to orthopedics for nerve conduction study and further evaluation for treatment.       Mar Daring, PA-C  [facility]

## 2015-10-06 ENCOUNTER — Ambulatory Visit (INDEPENDENT_AMBULATORY_CARE_PROVIDER_SITE_OTHER): Payer: BLUE CROSS/BLUE SHIELD | Admitting: Physician Assistant

## 2015-10-06 ENCOUNTER — Encounter: Payer: Self-pay | Admitting: Physician Assistant

## 2015-10-06 VITALS — BP 90/60 | HR 84 | Temp 97.9°F | Resp 16 | Wt 131.0 lb

## 2015-10-06 DIAGNOSIS — J0100 Acute maxillary sinusitis, unspecified: Secondary | ICD-10-CM

## 2015-10-06 DIAGNOSIS — F32A Depression, unspecified: Secondary | ICD-10-CM

## 2015-10-06 DIAGNOSIS — G5603 Carpal tunnel syndrome, bilateral upper limbs: Secondary | ICD-10-CM

## 2015-10-06 DIAGNOSIS — F329 Major depressive disorder, single episode, unspecified: Secondary | ICD-10-CM

## 2015-10-06 MED ORDER — BUPROPION HCL ER (SR) 150 MG PO TB12: 150.0000 mg | ORAL_TABLET | Freq: Two times a day (BID) | ORAL | Status: DC

## 2015-10-06 MED ORDER — AMOXICILLIN-POT CLAVULANATE 875-125 MG PO TABS: 1.0000 | ORAL_TABLET | Freq: Two times a day (BID) | ORAL | Status: DC

## 2015-10-06 NOTE — Progress Notes (Signed)
Patient: Julie Boyer Female    DOB: 08-Jul-1961   54 y.o.   MRN: [id number] Visit Date: 10/06/2015  Today's Provider: Mar Daring, PA-C   Chief Complaint  Patient presents with  . Follow-up    Depression and Carpal Tunnel  . Sinusitis   Subjective:    Depression        Chronicity: Patient here following on Depression.  The problem has been gradually worsening since onset.  Associated symptoms include fatigue (sleeping more), insomnia (wakes up once or twice and feels like can't get out of bed in the morning.), restlessness, appetite change and headaches.( Feeling more tense and smoking more)     The symptoms are aggravated by medication.  Treatments tried: Patient is currently on Lexapro 5 mg.  Compliance with treatment is good. Sinusitis This is a new problem. The current episode started 1 to 4 weeks ago. The problem has been gradually worsening since onset. There has been no fever. Associated symptoms include congestion, coughing, headaches and sinus pressure. Pertinent negatives include no chills, diaphoresis, ear pain (ear fullness), shortness of breath, sneezing or sore throat. Past treatments include spray decongestants and oral decongestants (Zyrtec).    Carpal tunnel Syndrome of right wrist: Patient here following on her wrist.Patient on last office visit had carpal tunnel bilaterally but with the right worse than the left. Patient states still feeling numbness and tingling. She has been using night splints and anti-inflammatories as needed. She states she does not wear the night brace regularly on the right and she does not have a brace for the left currently. The right wrist she does complain of numbness and tingling almost consistently into the right middle finger, right index finger, right ring finger and pinky. The left is not consistent and mostly affects the left middle finger and left ring finger.     Allergies  Allergen Reactions  . Azithromycin Other  (See Comments)    Z-Pak: Red splotches   Previous Medications   BIOTIN FORTE PO    Take by mouth daily.   CETIRIZINE (ZYRTEC) 10 MG TABLET    Take 1 tablet by mouth daily.   CYANOCOBALAMIN (VITAMIN B-12 PO)    Take by mouth daily.   ESCITALOPRAM (LEXAPRO) 5 MG TABLET    Take 1 tablet (5 mg total) by mouth at bedtime.   ESTRADIOL (ESTRACE) 0.5 MG TABLET    Take 1 tablet by mouth daily.   MISC NATURAL PRODUCTS (OSTEO BI-FLEX ADV DOUBLE ST PO)    Take by mouth daily.   MOMETASONE (NASONEX) 50 MCG/ACT NASAL SPRAY    Place 1 spray into the nose daily.   SYNTHROID 100 MCG TABLET    Take 1 tablet by mouth daily.    Review of Systems  Constitutional: Positive for appetite change and fatigue (sleeping more). Negative for fever, chills and diaphoresis.  HENT: Positive for congestion, postnasal drip, sinus pressure and voice change. Negative for ear pain (ear fullness), rhinorrhea, sneezing, sore throat, tinnitus and trouble swallowing.   Eyes: Negative.         Watery eyes  Respiratory: Positive for cough. Negative for chest tightness, shortness of breath and wheezing.   Cardiovascular: Negative for chest pain.  Gastrointestinal: Negative for nausea, vomiting and abdominal pain.  Neurological: Positive for numbness (right and left hands) and headaches.  Psychiatric/Behavioral: Positive for depression. The patient has insomnia (wakes up once or twice and feels like can't get out of bed in the  morning.).     Social History  Substance Use Topics  . Smoking status: Current Every Day Smoker -- 1.00 packs/day for 20 years  . Smokeless tobacco: Never Used  . Alcohol Use: 3.6 oz/week    0 Standard drinks or equivalent, 6 Shots of liquor per week     Comment: OCCASIONALLY   Objective:   BP 90/60 mmHg  Pulse 84  Temp(Src) 97.9 F (36.6 C) (Oral)  Resp 16  Wt 131 lb (59.421 kg)  Physical Exam  Constitutional: She appears well-developed and well-nourished. No distress.  HENT:  Head:  Normocephalic and atraumatic.  Right Ear: Hearing, external ear and ear canal normal. A middle ear effusion is present.  Left Ear: Hearing, external ear and ear canal normal. A middle ear effusion is present.  Nose: Mucosal edema present. No rhinorrhea. Right sinus exhibits maxillary sinus tenderness. Right sinus exhibits no frontal sinus tenderness. Left sinus exhibits maxillary sinus tenderness. Left sinus exhibits no frontal sinus tenderness.  Mouth/Throat: Uvula is midline, oropharynx is clear and moist and mucous membranes are normal. No oropharyngeal exudate, posterior oropharyngeal edema or posterior oropharyngeal erythema.  Neck: Normal range of motion. Neck supple. No tracheal deviation present. No thyromegaly present.  Cardiovascular: Normal rate, regular rhythm and normal heart sounds.  Exam reveals no gallop and no friction rub.   No murmur heard. Pulmonary/Chest: Effort normal and breath sounds normal. No stridor. No respiratory distress. She has no wheezes. She has no rales.  Musculoskeletal:       Right wrist: Normal.       Left wrist: Normal.  Positive Phalen sign bilaterally. Negative Tinel sign bilaterally. Grip strength equal bilaterally.  Lymphadenopathy:    She has no cervical adenopathy.  Skin: She is not diaphoretic.  Vitals reviewed.       Assessment & Plan:     1. Depression Lexapro was causing too much daytime somnolence. She states she is also noticing increasing smoking with Lexapro. We will switch back to Wellbutrin as she felt that it worked better. Wellbutrin as prescribed as below 150 mg twice daily. I will see her back in 2 months to evaluate how she is doing with the change back to Wellbutrin. She is to call the office in the meantime if she has any worsening symptoms, acute issues, questions or concerns. - buPROPion (WELLBUTRIN SR) 150 MG 12 hr tablet; Take 1 tablet (150 mg total) by mouth 2 (two) times daily.  Dispense: 60 tablet; Refill: 1  2. Acute  maxillary sinusitis, recurrence not specified Worsening over the last 2 weeks. She does have history of recurrent sinusitis and did used to see an ENT specialist at [facility]. She states she is been well controlled with Zyrtec and Nasacort nasal spray. She has not had a sinus infection in approximately 6-8 months. I will treat with Augmentin as below. She is to call the office if symptoms fail to improve or worsen. - amoxicillin-clavulanate (AUGMENTIN) 875-125 MG tablet; Take 1 tablet by mouth 2 (two) times daily.  Dispense: 20 tablet; Refill: 0  3. Bilateral carpal tunnel syndrome No improvement with conservative treatments. She still does not wish to be referred to orthopedics at this time. She will continue with conservative treatment with anti-inflammatories and night splints. I did advise her to call the office if her hands start to bother her more or begin waking her up more at night. She voiced understanding and agrees to do so. If her hands do worsen we will start referral  to orthopedics for further evaluation with nerve conduction study and treatment at that time. Until then she is to call the office if needed. If she does not have any changes I will see her back in 2 months to reevaluate how she is doing.       Mar Daring, PA-C  [facility]

## 2015-10-06 NOTE — Patient Instructions (Signed)
Major Depressive Disorder Major depressive disorder is a mental illness. It also may be called clinical depression or unipolar depression. Major depressive disorder usually causes feelings of sadness, hopelessness, or helplessness. Some people with this disorder do not feel particularly sad but lose interest in doing things they used to enjoy (anhedonia). Major depressive disorder also can cause physical symptoms. It can interfere with work, school, relationships, and other normal everyday activities. The disorder varies in severity but is longer lasting and more serious than the sadness we all feel from time to time in our lives. Major depressive disorder often is triggered by stressful life events or major life changes. Examples of these triggers include divorce, loss of your job or home, a move, and the death of a family member or close friend. Sometimes this disorder occurs for no obvious reason at all. People who have family members with major depressive disorder or bipolar disorder are at higher risk for developing this disorder, with or without life stressors. Major depressive disorder can occur at any age. It may occur just once in your life (single episode major depressive disorder). It may occur multiple times (recurrent major depressive disorder). SYMPTOMS People with major depressive disorder have either anhedonia or depressed mood on nearly a daily basis for at least 2 weeks or longer. Symptoms of depressed mood include:  Feelings of sadness (blue or down in the dumps) or emptiness.  Feelings of hopelessness or helplessness.  Tearfulness or episodes of crying (may be observed by others).  Irritability (children and adolescents). In addition to depressed mood or anhedonia or both, people with this disorder have at least four of the following symptoms:  Difficulty sleeping or sleeping too much.   Significant change (increase or decrease) in appetite or weight.   Lack of energy or  motivation.  Feelings of guilt and worthlessness.   Difficulty concentrating, remembering, or making decisions.  Unusually slow movement (psychomotor retardation) or restlessness (as observed by others).   Recurrent wishes for death, recurrent thoughts of self-harm (suicide), or a suicide attempt. People with major depressive disorder commonly have persistent negative thoughts about themselves, other people, and the world. People with severe major depressive disorder may experiencedistorted beliefs or perceptions about the world (psychotic delusions). They also may see or hear things that are not real (psychotic hallucinations). DIAGNOSIS Major depressive disorder is diagnosed through an assessment by your health care provider. Your health care provider will ask aboutaspects of your daily life, such as mood,sleep, and appetite, to see if you have the diagnostic symptoms of major depressive disorder. Your health care provider may ask about your medical history and use of alcohol or drugs, including prescription medicines. Your health care provider also may do a physical exam and blood work. This is because certain medical conditions and the use of certain substances can cause major depressive disorder-like symptoms (secondary depression). Your health care provider also may refer you to a mental health specialist for further evaluation and treatment. TREATMENT It is important to recognize the symptoms of major depressive disorder and seek treatment. The following treatments can be prescribed for this disorder:   Medicine. Antidepressant medicines usually are prescribed. Antidepressant medicines are thought to correct chemical imbalances in the brain that are commonly associated with major depressive disorder. Other types of medicine may be added if the symptoms do not respond to antidepressant medicines alone or if psychotic delusions or hallucinations occur.  Talk therapy. Talk therapy can be  helpful in treating major depressive disorder by providing   support, education, and guidance. Certain types of talk therapy also can help with negative thinking (cognitive behavioral therapy) and with relationship issues that trigger this disorder (interpersonal therapy). A mental health specialist can help determine which treatment is best for you. Most people with major depressive disorder do well with a combination of medicine and talk therapy. Treatments involving electrical stimulation of the brain can be used in situations with extremely severe symptoms or when medicine and talk therapy do not work over time. These treatments include electroconvulsive therapy, transcranial magnetic stimulation, and vagal nerve stimulation.   This information is not intended to replace advice given to you by your health care provider. Make sure you discuss any questions you have with your health care provider.   Document Released: 01/21/2013 Document Revised: 10/17/2014 Document Reviewed: 01/21/2013 Elsevier Interactive Patient Education 2016 [employer] Inc[employer]  

## 2015-11-06 ENCOUNTER — Telehealth: Payer: Self-pay | Admitting: Physician Assistant

## 2015-11-06 NOTE — Telephone Encounter (Signed)
Pt called saying she was in 3 weeks ago and you gave her an antibiotic for sinus infection.  She said it got a little better and when she finished the antibiotic it got worse  She uses [facility] in [place].  Her call back is [phone]  Thanks [name].

## 2015-11-06 NOTE — Telephone Encounter (Signed)
This is a patient of Tawanna Sat and she is out of the office. Please advise.

## 2015-11-06 NOTE — Telephone Encounter (Signed)
Will need recheck appointment if this has been going on for a month now. May need blood tests and possible CT scan of sinuses.

## 2015-11-09 NOTE — Telephone Encounter (Signed)
LMTCB

## 2015-11-12 NOTE — Telephone Encounter (Signed)
Left patient a message advising her to call back for a recheck appointment.

## 2015-12-07 ENCOUNTER — Ambulatory Visit (INDEPENDENT_AMBULATORY_CARE_PROVIDER_SITE_OTHER): Payer: BLUE CROSS/BLUE SHIELD | Admitting: Physician Assistant

## 2015-12-07 ENCOUNTER — Encounter: Payer: Self-pay | Admitting: Physician Assistant

## 2015-12-07 VITALS — BP 100/60 | HR 70 | Temp 97.7°F | Resp 16 | Wt 132.6 lb

## 2015-12-07 DIAGNOSIS — F329 Major depressive disorder, single episode, unspecified: Secondary | ICD-10-CM | POA: Diagnosis not present

## 2015-12-07 DIAGNOSIS — F32A Depression, unspecified: Secondary | ICD-10-CM

## 2015-12-07 DIAGNOSIS — J0100 Acute maxillary sinusitis, unspecified: Secondary | ICD-10-CM | POA: Diagnosis not present

## 2015-12-07 MED ORDER — BUPROPION HCL ER (SR) 150 MG PO TB12: 150.0000 mg | ORAL_TABLET | Freq: Two times a day (BID) | ORAL | Status: DC

## 2015-12-07 MED ORDER — AMOXICILLIN-POT CLAVULANATE 875-125 MG PO TABS: 1.0000 | ORAL_TABLET | Freq: Two times a day (BID) | ORAL | Status: DC

## 2015-12-07 NOTE — Progress Notes (Signed)
Patient: Julie Boyer Female    DOB: [birth year]   55 y.o.   MRN: [id number] Visit Date: 12/07/2015  Today's Provider: Mar Daring, PA-C   Chief Complaint  Patient presents with  . Follow-up    Depression   Subjective:    HPI Julie Boyer is here today for her two month follow-up on Depression. Last office visit patient was on Lexapro and was causing too much daytime somnolence. She stated she was also noticing the increasing in smoking with Lexapro. She got switched back to Wellbutrin as she felt that it worked better. Wellbutrin was prescribed 150 mg twice daily and she is here to see how she is doing. She states she is feeling much better, and is stable on her symptoms. She is having sinus pressure and post nasal drip. She is taking sudafed, zyrtec and nasonex without relief.      Allergies  Allergen Reactions  . Azithromycin Other (See Comments)    Z-Pak: Red splotches   Previous Medications   AMOXICILLIN-CLAVULANATE (AUGMENTIN) 875-125 MG TABLET    Take 1 tablet by mouth 2 (two) times daily.   BIOTIN FORTE PO    Take by mouth daily.   BUPROPION (WELLBUTRIN SR) 150 MG 12 HR TABLET    Take 1 tablet (150 mg total) by mouth 2 (two) times daily.   CETIRIZINE (ZYRTEC) 10 MG TABLET    Take 1 tablet by mouth daily.   CYANOCOBALAMIN (VITAMIN B-12 PO)    Take by mouth daily.   ESTRADIOL (ESTRACE) 0.5 MG TABLET    Take 1 tablet by mouth daily.   MISC NATURAL PRODUCTS (OSTEO BI-FLEX ADV DOUBLE ST PO)    Take by mouth daily.   MOMETASONE (NASONEX) 50 MCG/ACT NASAL SPRAY    Place 1 spray into the nose daily.   SYNTHROID 100 MCG TABLET    Take 1 tablet by mouth daily.    Review of Systems  Constitutional: Negative for fever, chills and fatigue.  HENT: Positive for congestion, postnasal drip, rhinorrhea and sinus pressure. Negative for ear pain and sneezing.   Respiratory: Positive for cough (in the mornings). Negative for chest tightness, shortness of breath and  wheezing.   Cardiovascular: Negative for chest pain.  Gastrointestinal: Negative for nausea, vomiting and abdominal pain.  Neurological: Negative for dizziness and headaches.  Psychiatric/Behavioral: Negative for sleep disturbance, decreased concentration and agitation. The patient is not nervous/anxious.     Social History  Substance Use Topics  . Smoking status: Current Every Day Smoker -- 1.00 packs/day for 20 years  . Smokeless tobacco: Never Used  . Alcohol Use: 3.6 oz/week    0 Standard drinks or equivalent, 6 Shots of liquor per week     Comment: OCCASIONALLY   Objective:   BP 100/60 mmHg  Pulse 70  Temp(Src) 97.7 F (36.5 C) (Oral)  Resp 16  Wt 132 lb 9.6 oz (60.147 kg)  Physical Exam  Constitutional: She appears well-developed and well-nourished. No distress.  HENT:  Head: Normocephalic and atraumatic.  Right Ear: Hearing, external ear and ear canal normal. Tympanic membrane is not erythematous and not bulging. A middle ear effusion (clear) is present.  Left Ear: Hearing, external ear and ear canal normal. Tympanic membrane is not erythematous and not bulging. A middle ear effusion (clear) is present.  Nose: Mucosal edema present. No rhinorrhea. Right sinus exhibits maxillary sinus tenderness. Right sinus exhibits no frontal sinus tenderness. Left sinus exhibits no maxillary sinus tenderness  and no frontal sinus tenderness.  Mouth/Throat: Uvula is midline, oropharynx is clear and moist and mucous membranes are normal. No oropharyngeal exudate, posterior oropharyngeal edema or posterior oropharyngeal erythema.  Neck: Normal range of motion. Neck supple. No tracheal deviation present. No thyromegaly present.  Cardiovascular: Normal rate, regular rhythm and normal heart sounds.  Exam reveals no gallop and no friction rub.   No murmur heard. Pulmonary/Chest: Effort normal and breath sounds normal. No stridor. No respiratory distress. She has no wheezes. She has no rales.    Lymphadenopathy:    She has no cervical adenopathy.  Skin: She is not diaphoretic.  Psychiatric: She has a normal mood and affect. Her behavior is normal. Judgment and thought content normal.  Vitals reviewed.       Assessment & Plan:     1. Depression Stable. Will continue current medical treatment plan. She is to call the office if she has any worsening symptoms or acute issues.  I will see her back in 4-5 months for her annual physical exam. - buPROPion (WELLBUTRIN SR) 150 MG 12 hr tablet; Take 1 tablet (150 mg total) by mouth 2 (two) times daily.  Dispense: 180 tablet; Refill: 1  2. Acute maxillary sinusitis, recurrence not specified Worsening symptoms that have not responded to her current regimen for seasonal allergies. Will treat with augmentin as below.  She is to continue her current allergy regimen as well.  Stay well hydrated. She is to call the office if no improvement. - amoxicillin-clavulanate (AUGMENTIN) 875-125 MG tablet; Take 1 tablet by mouth 2 (two) times daily.  Dispense: 20 tablet; Refill: 0       Mar Daring, PA-C  [facility]

## 2015-12-07 NOTE — Patient Instructions (Signed)

## 2016-01-11 ENCOUNTER — Ambulatory Visit (INDEPENDENT_AMBULATORY_CARE_PROVIDER_SITE_OTHER): Payer: BLUE CROSS/BLUE SHIELD | Admitting: Physician Assistant

## 2016-01-11 ENCOUNTER — Encounter: Payer: Self-pay | Admitting: Physician Assistant

## 2016-01-11 VITALS — BP 98/58 | HR 67 | Temp 99.0°F | Resp 14 | Wt 131.4 lb

## 2016-01-11 DIAGNOSIS — R05 Cough: Secondary | ICD-10-CM | POA: Diagnosis not present

## 2016-01-11 DIAGNOSIS — J40 Bronchitis, not specified as acute or chronic: Secondary | ICD-10-CM

## 2016-01-11 DIAGNOSIS — R059 Cough, unspecified: Secondary | ICD-10-CM

## 2016-01-11 DIAGNOSIS — J0140 Acute pansinusitis, unspecified: Secondary | ICD-10-CM

## 2016-01-11 MED ORDER — MOXIFLOXACIN HCL 400 MG PO TABS: 400.0000 mg | ORAL_TABLET | Freq: Every day | ORAL | Status: DC

## 2016-01-11 MED ORDER — BENZONATATE 200 MG PO CAPS: 200.0000 mg | ORAL_CAPSULE | Freq: Three times a day (TID) | ORAL | Status: DC | PRN

## 2016-01-11 NOTE — Patient Instructions (Signed)

## 2016-01-11 NOTE — Progress Notes (Signed)
Patient ID: [name], female   DOB: 12/30/1960, 55 y.o.   MRN: [id number]   Patient: Julie Boyer Female    DOB: 1961/04/30   55 y.o.   MRN: [id number] Visit Date: 01/11/2016  Today's Provider: Mar Daring, PA-C   Chief Complaint  Patient presents with  . URI   Subjective:    URI  This is a new problem. The current episode started 1 to 4 weeks ago. The problem has been unchanged. Maximum temperature: low grade. Associated symptoms include congestion, coughing, headaches, a plugged ear sensation, rhinorrhea, sinus pain and sneezing. Pertinent negatives include no chest pain, diarrhea, ear pain (fullness), joint pain, nausea, neck pain, rash, sore throat, swollen glands or wheezing. Associated symptoms comments: Chills, body aches. She has tried decongestant (OTC cough medication) for the symptoms. The treatment provided no relief.      Previous Medications   BIOTIN FORTE PO    Take by mouth daily.   BUPROPION (WELLBUTRIN SR) 150 MG 12 HR TABLET    Take 1 tablet (150 mg total) by mouth 2 (two) times daily.   CETIRIZINE (ZYRTEC) 10 MG TABLET    Take 1 tablet by mouth daily.   CYANOCOBALAMIN (VITAMIN B-12 PO)    Take by mouth daily.   ESTRADIOL (ESTRACE) 0.5 MG TABLET    Take 1 tablet by mouth daily.   MISC NATURAL PRODUCTS (OSTEO BI-FLEX ADV DOUBLE ST PO)    Take by mouth daily.   MOMETASONE (NASONEX) 50 MCG/ACT NASAL SPRAY    Place 1 spray into the nose daily.   SYNTHROID 100 MCG TABLET    Take 1 tablet by mouth daily.   Allergies  Allergen Reactions  . Azithromycin Other (See Comments)    Z-Pak: Red splotches    Review of Systems  Constitutional: Positive for chills.  HENT: Positive for congestion, rhinorrhea and sneezing. Negative for ear pain (fullness) and sore throat.   Eyes: Negative.   Respiratory: Positive for cough. Negative for wheezing.   Cardiovascular: Negative.  Negative for chest pain.  Gastrointestinal: Negative.  Negative for nausea and diarrhea.    Endocrine: Negative.   Genitourinary: Negative.   Musculoskeletal: Negative.  Negative for joint pain and neck pain.  Skin: Negative.  Negative for rash.  Allergic/Immunologic: Negative.   Neurological: Positive for headaches.  Hematological: Negative.   Psychiatric/Behavioral: Negative.     Social History  Substance Use Topics  . Smoking status: Current Every Day Smoker -- 1.00 packs/day for 20 years  . Smokeless tobacco: Never Used  . Alcohol Use: 3.6 oz/week    0 Standard drinks or equivalent, 6 Shots of liquor per week     Comment: OCCASIONALLY   Objective:   BP 98/58 mmHg  Pulse 67  Temp(Src) 99 F (37.2 C) (Oral)  Resp 14  Wt 131 lb 6.4 oz (59.603 kg)  Physical Exam  Constitutional: She appears well-developed and well-nourished. No distress.  HENT:  Head: Normocephalic and atraumatic.  Right Ear: Hearing, external ear and ear canal normal. Tympanic membrane is not erythematous and not bulging. A middle ear effusion is present.  Left Ear: Hearing, external ear and ear canal normal. Tympanic membrane is not erythematous and not bulging. A middle ear effusion is present.  Nose: Mucosal edema and rhinorrhea present. Right sinus exhibits maxillary sinus tenderness and frontal sinus tenderness. Left sinus exhibits maxillary sinus tenderness and frontal sinus tenderness.  Mouth/Throat: Uvula is midline, oropharynx is clear and moist and mucous membranes are normal.  No oropharyngeal exudate, posterior oropharyngeal edema or posterior oropharyngeal erythema.  Eyes: Conjunctivae are normal. Pupils are equal, round, and reactive to light. Right eye exhibits no discharge. Left eye exhibits no discharge. No scleral icterus.  Neck: Normal range of motion. Neck supple. No JVD present. No tracheal deviation present. No thyromegaly present.  Cardiovascular: Normal rate, regular rhythm and normal heart sounds.  Exam reveals no gallop and no friction rub.   No murmur  heard. Pulmonary/Chest: Effort normal and breath sounds normal. No stridor. No respiratory distress. She has no wheezes. She has no rales.  Lymphadenopathy:    She has no cervical adenopathy.  Skin: Skin is warm and dry. She is not diaphoretic.  Vitals reviewed.       Assessment & Plan:     1. Bronchitis Worsening symptoms with pleural rub heard over RLL. Will treat with avelox as below. Advised to use IBU for fever and body aches. She needs to stay well hydrated and get plenty of rest. She is to call if cough worsens or does not improve and we will get a CXR. - moxifloxacin (AVELOX) 400 MG tablet; Take 1 tablet (400 mg total) by mouth daily.  Dispense: 10 tablet; Refill: 0  2. Acute pansinusitis, recurrence not specified See above.   3. Cough Tessalon perles given for better cough suppression for her to have while at work without causing drowsiness. She is to call if cough persists or worsens for CXR. - benzonatate (TESSALON) 200 MG capsule; Take 1 capsule (200 mg total) by mouth 3 (three) times daily as needed for cough.  Dispense: 30 capsule; Refill: 0   Follow up: No Follow-up on file.

## 2016-04-08 ENCOUNTER — Encounter: Payer: Self-pay | Admitting: Physician Assistant

## 2016-04-08 ENCOUNTER — Ambulatory Visit (INDEPENDENT_AMBULATORY_CARE_PROVIDER_SITE_OTHER): Payer: BLUE CROSS/BLUE SHIELD | Admitting: Physician Assistant

## 2016-04-08 VITALS — BP 110/68 | HR 76 | Temp 97.9°F | Resp 16 | Ht 63.0 in | Wt 125.6 lb

## 2016-04-08 DIAGNOSIS — Z136 Encounter for screening for cardiovascular disorders: Secondary | ICD-10-CM | POA: Diagnosis not present

## 2016-04-08 DIAGNOSIS — L03011 Cellulitis of right finger: Secondary | ICD-10-CM | POA: Diagnosis not present

## 2016-04-08 DIAGNOSIS — E039 Hypothyroidism, unspecified: Secondary | ICD-10-CM

## 2016-04-08 DIAGNOSIS — Z0000 Encounter for general adult medical examination without abnormal findings: Secondary | ICD-10-CM

## 2016-04-08 DIAGNOSIS — IMO0001 Reserved for inherently not codable concepts without codable children: Secondary | ICD-10-CM

## 2016-04-08 DIAGNOSIS — Z13220 Encounter for screening for lipoid disorders: Secondary | ICD-10-CM | POA: Diagnosis not present

## 2016-04-08 DIAGNOSIS — Z833 Family history of diabetes mellitus: Secondary | ICD-10-CM | POA: Diagnosis not present

## 2016-04-08 DIAGNOSIS — F32A Depression, unspecified: Secondary | ICD-10-CM

## 2016-04-08 DIAGNOSIS — F329 Major depressive disorder, single episode, unspecified: Secondary | ICD-10-CM | POA: Diagnosis not present

## 2016-04-08 MED ORDER — BUPROPION HCL ER (SR) 150 MG PO TB12: 150.0000 mg | ORAL_TABLET | Freq: Two times a day (BID) | ORAL | Status: DC

## 2016-04-08 MED ORDER — CEPHALEXIN 500 MG PO CAPS: 500.0000 mg | ORAL_CAPSULE | Freq: Two times a day (BID) | ORAL | Status: DC

## 2016-04-08 NOTE — Progress Notes (Signed)
Patient: Julie Boyer, Female    DOB: 02/26/1961, 55 y.o.   MRN: [id number] Visit Date: 04/08/2016  Today's Provider: Mar Daring, PA-C   Chief Complaint  Patient presents with  . Annual Exam   Subjective:    Annual physical exam Julie Boyer is a 55 y.o. female who presents today for health maintenance and complete physical. She feels fairly well, her brother is at the hospital with pneumonia and lung cancer. He is not doing well and Hospice was just called in yesterday for him.  She reports exercising none. She reports she is sleeping poorly due to stress and anxiety with her brother currently.. She gets her breast exam at the gyn. [facility]. She has had a hysterectomy.  Colonoscopy: 05/21/2015 Repeat in 5 years. -----------------------------------------------------------------   Review of Systems  Constitutional: Positive for appetite change and unexpected weight change.  HENT: Positive for sinus pressure.   Eyes: Negative.   Respiratory: Negative.   Cardiovascular: Negative.   Gastrointestinal: Positive for constipation.  Endocrine: Positive for cold intolerance.  Genitourinary: Negative.   Musculoskeletal: Positive for joint swelling.  Skin: Negative.   Allergic/Immunologic: Negative.   Neurological: Negative.   Hematological: Negative.   Psychiatric/Behavioral: The patient is nervous/anxious.     Social History      She  reports that she has been smoking.  She has never used smokeless tobacco. She reports that she drinks about 3.6 oz of alcohol per week. She reports that she does not use illicit drugs.       Social History   Social History  . Marital Status: Divorced    Spouse Name: N/A  . Number of Children: N/A  . Years of Education: N/A   Social History Main Topics  . Smoking status: Current Every Day Smoker -- 1.00 packs/day for 20 years  . Smokeless tobacco: Never Used  . Alcohol Use: 3.6 oz/week    0 Standard drinks or  equivalent, 6 Shots of liquor per week     Comment: OCCASIONALLY  . Drug Use: No  . Sexual Activity: Not Asked   Other Topics Concern  . None   Social History Narrative    Past Medical History  Diagnosis Date  . Thyroid disorder   . Wears dentures     partial lower  . Complication of anesthesia     sneezing after last colonoscopy  . Numbness and tingling in hands     pt thinks may be Carpal Tunnel issues  . Arthritis     hands     Patient Active Problem List   Diagnosis Date Noted  . Hx of colonic polyps   . Benign neoplasm of cecum   . Depression 05/01/2015  . Hypothyroidism 04/02/2015  . Sinus trouble 04/02/2015  . Fatigue 04/02/2015    Past Surgical History  Procedure Laterality Date  . Abdominal hysterectomy  1992  . Knee surgery Right 2011  . Colonoscopy    . Colonoscopy with propofol N/A 05/21/2015    Procedure: COLONOSCOPY WITH PROPOFOL;  Surgeon: [name], MD;  Location: [place];  Service: Endoscopy;  Laterality: N/A;  . Polypectomy  05/21/2015    Procedure: POLYPECTOMY;  Surgeon: [name], MD;  Location: [place];  Service: Endoscopy;;    Family History        Family Status  Relation Status Death Age  . Father Deceased   . Sister Alive   . Mother Deceased   .  Brother Deceased     1 YEAR AGO  . Daughter Alive   . Son Alive   . Brother Alive         Her family history includes Alzheimer's disease in her mother; Bone cancer in her father; Breast cancer in her sister; Cancer in her brother; Diabetes in her mother; Lung cancer in her brother; Prostate cancer in her father.    Allergies  Allergen Reactions  . Azithromycin Other (See Comments)    Z-Pak: Red splotches    Current Meds  Medication Sig  . BIOTIN FORTE PO Take by mouth daily.  Marland Kitchen buPROPion (WELLBUTRIN SR) 150 MG 12 hr tablet Take 1 tablet (150 mg total) by mouth 2 (two) times daily.  . cetirizine (ZYRTEC) 10 MG tablet Take 1 tablet by mouth daily.  .  Cyanocobalamin (VITAMIN B-12 PO) Take by mouth daily.  Marland Kitchen estradiol (ESTRACE) 0.5 MG tablet Take 1 tablet by mouth daily.  . Misc Natural Products (OSTEO BI-FLEX ADV DOUBLE ST PO) Take by mouth daily.  . mometasone (NASONEX) 50 MCG/ACT nasal spray Place 1 spray into the nose daily.  Marland Kitchen SYNTHROID 100 MCG tablet Take 1 tablet by mouth daily.    Patient Care Team: Mar Daring, PA-C as PCP - General (Physician Assistant)     Objective:   Vitals: BP 110/68 mmHg  Pulse 76  Temp(Src) 97.9 F (36.6 C)  Resp 16  Ht 5\' 3"  (1.6 m)  Wt 125 lb 9.6 oz (56.972 kg)  BMI 22.25 kg/m2   Physical Exam  Constitutional: She is oriented to person, place, and time. She appears well-developed and well-nourished. No distress.  HENT:  Head: Normocephalic and atraumatic.  Right Ear: Hearing, tympanic membrane, external ear and ear canal normal.  Left Ear: Hearing, tympanic membrane, external ear and ear canal normal.  Nose: Nose normal.  Mouth/Throat: Uvula is midline, oropharynx is clear and moist and mucous membranes are normal. No oropharyngeal exudate, posterior oropharyngeal edema or posterior oropharyngeal erythema.  Eyes: Conjunctivae and EOM are normal. Pupils are equal, round, and reactive to light. Right eye exhibits no discharge. Left eye exhibits no discharge. No scleral icterus.  Neck: Normal range of motion. Neck supple. No JVD present. Carotid bruit is not present. No tracheal deviation present. No thyromegaly present.  Cardiovascular: Normal rate, regular rhythm, normal heart sounds and intact distal pulses.  Exam reveals no gallop and no friction rub.   No murmur heard. Pulmonary/Chest: Effort normal and breath sounds normal. No respiratory distress. She has no wheezes. She has no rales. She exhibits no tenderness.  Abdominal: Soft. Bowel sounds are normal. She exhibits no distension and no mass. There is no tenderness. There is no rebound and no guarding.  Genitourinary:  Deferred to  [facility]  Musculoskeletal: Normal range of motion. She exhibits no edema or tenderness.  Lymphadenopathy:    She has no cervical adenopathy.  Neurological: She is alert and oriented to person, place, and time.  Skin: Skin is warm and dry. No rash noted. She is not diaphoretic.  Psychiatric: She has a normal mood and affect. Her behavior is normal. Judgment and thought content normal.  Vitals reviewed.    Depression Screen PHQ 2/9 Scores 04/02/2015  PHQ - 2 Score 6  PHQ- 9 Score 14      Assessment & Plan:     Routine Health Maintenance and Physical Exam  Exercise Activities and Dietary recommendations Goals    None      Immunization History  Administered Date(s) Administered  . Tdap 04/02/2015    Health Maintenance  Topic Date Due  . Hepatitis C Screening  1961-09-19  . HIV Screening  10/20/1974  . PAP SMEAR  10/20/1980  . MAMMOGRAM  10/20/2009  . INFLUENZA VACCINE  05/10/2016  . COLONOSCOPY  05/20/2018  . TETANUS/TDAP  04/01/2025      Discussed health benefits of physical activity, and encouraged her to engage in regular exercise appropriate for her age and condition.   1. Annual physical exam Normal physical exam today. Will check labs as below and f/u pending lab results. If labs are stable and WNL she will not need to have these rechecked for one year at her next annual physical exam. She is to call the office in the meantime if she has any acute issue, questions or concerns. - CBC with Differential/Platelet - Comprehensive metabolic panel  2. Hypothyroidism, unspecified hypothyroidism type Will check labs and f/u pending results. - TSH  3. Family history of diabetes mellitus in mother Will check labs and f/u pending results. - Hemoglobin A1c  4. Encounter for lipid screening for cardiovascular disease Will check labs and f/u pending results. - Lipid panel  5. Paronychia of fourth finger of right hand New onset. Has been treating with a topical  cream. Nail bed is red, tender to touch. No drainage. Will treat with keflex as below. She is to call if no improvement. - cephALEXin (KEFLEX) 500 MG capsule; Take 1 capsule (500 mg total) by mouth 2 (two) times daily.  Dispense: 14 capsule; Refill: 0  6. Depression Stable. Diagnosis pulled for medication refill. Continue current medical treatment plan. - buPROPion (WELLBUTRIN SR) 150 MG 12 hr tablet; Take 1 tablet (150 mg total) by mouth 2 (two) times daily.  Dispense: 180 tablet; Refill: 3  --------------------------------------------------------------------    Mar Daring, PA-C  [facility]

## 2016-04-08 NOTE — Patient Instructions (Signed)
Menopause is a normal process in which your reproductive ability comes to an end. This process happens gradually over a span of months to years, usually between the ages of 48 and 55. Menopause is complete when you have missed 12 consecutive menstrual periods. It is important to talk with your health care provider about some of the most common conditions that affect postmenopausal women, such as heart disease, cancer, and bone loss (osteoporosis). Adopting a healthy lifestyle and getting preventive care can help to promote your health and wellness. Those actions can also lower your chances of developing some of these common conditions. WHAT SHOULD I KNOW ABOUT MENOPAUSE? During menopause, you may experience a number of symptoms, such as:  Moderate-to-severe hot flashes.  Night sweats.  Decrease in sex drive.  Mood swings.  Headaches.  Tiredness.  Irritability.  Memory problems.  Insomnia. Choosing to treat or not to treat menopausal changes is an individual decision that you make with your health care provider. WHAT SHOULD I KNOW ABOUT HORMONE REPLACEMENT THERAPY AND SUPPLEMENTS? Hormone therapy products are effective for treating symptoms that are associated with menopause, such as hot flashes and night sweats. Hormone replacement carries certain risks, especially as you become older. If you are thinking about using estrogen or estrogen with progestin treatments, discuss the benefits and risks with your health care provider. WHAT SHOULD I KNOW ABOUT HEART DISEASE AND STROKE? Heart disease, heart attack, and stroke become more likely as you age. This may be due, in part, to the hormonal changes that your body experiences during menopause. These can affect how your body processes dietary fats, triglycerides, and cholesterol. Heart attack and stroke are both medical emergencies. There are many things that you can do to help prevent heart disease and stroke:  Have your blood pressure  checked at least every 1-2 years. High blood pressure causes heart disease and increases the risk of stroke.  If you are 55-79 years old, ask your health care provider if you should take aspirin to prevent a heart attack or a stroke.  Do not use any tobacco products, including cigarettes, chewing tobacco, or electronic cigarettes. If you need help quitting, ask your health care provider.  It is important to eat a healthy diet and maintain a healthy weight.  Be sure to include plenty of vegetables, fruits, low-fat dairy products, and lean protein.  Avoid eating foods that are high in solid fats, added sugars, or salt (sodium).  Get regular exercise. This is one of the most important things that you can do for your health.  Try to exercise for at least 150 minutes each week. The type of exercise that you do should increase your heart rate and make you sweat. This is known as moderate-intensity exercise.  Try to do strengthening exercises at least twice each week. Do these in addition to the moderate-intensity exercise.  Know your numbers.Ask your health care provider to check your cholesterol and your blood glucose. Continue to have your blood tested as directed by your health care provider. WHAT SHOULD I KNOW ABOUT CANCER SCREENING? There are several types of cancer. Take the following steps to reduce your risk and to catch any cancer development as early as possible. Breast Cancer  Practice breast self-awareness.  This means understanding how your breasts normally appear and feel.  It also means doing regular breast self-exams. Let your health care provider know about any changes, no matter how small.  If you are 40 or older, have a clinician do a   breast exam (clinical breast exam or CBE) every year. Depending on your age, family history, and medical history, it may be recommended that you also have a yearly breast X-ray (mammogram).  If you have a family history of breast cancer,  talk with your health care provider about genetic screening.  If you are at high risk for breast cancer, talk with your health care provider about having an MRI and a mammogram every year.  Breast cancer (BRCA) gene test is recommended for women who have family members with BRCA-related cancers. Results of the assessment will determine the need for genetic counseling and BRCA1 and for BRCA2 testing. BRCA-related cancers include these types:  Breast. This occurs in males or females.  Ovarian.  Tubal. This may also be called fallopian tube cancer.  Cancer of the abdominal or pelvic lining (peritoneal cancer).  Prostate.  Pancreatic. Cervical, Uterine, and Ovarian Cancer Your health care provider may recommend that you be screened regularly for cancer of the pelvic organs. These include your ovaries, uterus, and vagina. This screening involves a pelvic exam, which includes checking for microscopic changes to the surface of your cervix (Pap test).  For women ages 21-65, health care providers may recommend a pelvic exam and a Pap test every three years. For women ages 77-65, they may recommend the Pap test and pelvic exam, combined with testing for human papilloma virus (HPV), every five years. Some types of HPV increase your risk of cervical cancer. Testing for HPV may also be done on women of any age who have unclear Pap test results.  Other health care providers may not recommend any screening for nonpregnant women who are considered low risk for pelvic cancer and have no symptoms. Ask your health care provider if a screening pelvic exam is right for you.  If you have had past treatment for cervical cancer or a condition that could lead to cancer, you need Pap tests and screening for cancer for at least 20 years after your treatment. If Pap tests have been discontinued for you, your risk factors (such as having a new sexual partner) need to be reassessed to determine if you should start having  screenings again. Some women have medical problems that increase the chance of getting cervical cancer. In these cases, your health care provider may recommend that you have screening and Pap tests more often.  If you have a family history of uterine cancer or ovarian cancer, talk with your health care provider about genetic screening.  If you have vaginal bleeding after reaching menopause, tell your health care provider.  There are currently no reliable tests available to screen for ovarian cancer. Lung Cancer Lung cancer screening is recommended for adults 3-70 years old who are at high risk for lung cancer because of a history of smoking. A yearly low-dose CT scan of the lungs is recommended if you:  Currently smoke.  Have a history of at least 30 pack-years of smoking and you currently smoke or have quit within the past 15 years. A pack-year is smoking an average of one pack of cigarettes per day for one year. Yearly screening should:  Continue until it has been 15 years since you quit.  Stop if you develop a health problem that would prevent you from having lung cancer treatment. Colorectal Cancer  This type of cancer can be detected and can often be prevented.  Routine colorectal cancer screening usually begins at age 38 and continues through age 12.  If you have  risk factors for colon cancer, your health care provider may recommend that you be screened at an earlier age.  If you have a family history of colorectal cancer, talk with your health care provider about genetic screening.  Your health care provider may also recommend using home test kits to check for hidden blood in your stool.  A small camera at the end of a tube can be used to examine your colon directly (sigmoidoscopy or colonoscopy). This is done to check for the earliest forms of colorectal cancer.  Direct examination of the colon should be repeated every 5-10 years until age 67. However, if early forms of  precancerous polyps or small growths are found or if you have a family history or genetic risk for colorectal cancer, you may need to be screened more often. Skin Cancer  Check your skin from head to toe regularly.  Monitor any moles. Be sure to tell your health care provider:  About any new moles or changes in moles, especially if there is a change in a mole's shape or color.  If you have a mole that is larger than the size of a pencil eraser.  If any of your family members has a history of skin cancer, especially at a young age, talk with your health care provider about genetic screening.  Always use sunscreen. Apply sunscreen liberally and repeatedly throughout the day.  Whenever you are outside, protect yourself by wearing long sleeves, pants, a wide-brimmed hat, and sunglasses. WHAT SHOULD I KNOW ABOUT OSTEOPOROSIS? Osteoporosis is a condition in which bone destruction happens more quickly than new bone creation. After menopause, you may be at an increased risk for osteoporosis. To help prevent osteoporosis or the bone fractures that can happen because of osteoporosis, the following is recommended:  If you are 39-61 years old, get at least 1,000 mg of calcium and at least 600 mg of vitamin D per day.  If you are older than age 16 but younger than age 7, get at least 1,200 mg of calcium and at least 600 mg of vitamin D per day.  If you are older than age 47, get at least 1,200 mg of calcium and at least 800 mg of vitamin D per day. Smoking and excessive alcohol intake increase the risk of osteoporosis. Eat foods that are rich in calcium and vitamin D, and do weight-bearing exercises several times each week as directed by your health care provider. WHAT SHOULD I KNOW ABOUT HOW MENOPAUSE AFFECTS Julie Boyer? Depression may occur at any age, but it is more common as you become older. Common symptoms of depression include:  Low or sad mood.  Changes in sleep patterns.  Changes  in appetite or eating patterns.  Feeling an overall lack of motivation or enjoyment of activities that you previously enjoyed.  Frequent crying spells. Talk with your health care provider if you think that you are experiencing depression. WHAT SHOULD I KNOW ABOUT IMMUNIZATIONS? It is important that you get and maintain your immunizations. These include:  Tetanus, diphtheria, and pertussis (Tdap) booster vaccine.  Influenza every year before the flu season begins.  Pneumonia vaccine.  Shingles vaccine. Your health care provider may also recommend other immunizations.   This information is not intended to replace advice given to you by your health care provider. Make sure you discuss any questions you have with your health care provider.   Document Released: 11/18/2005 Document Revised: 10/17/2014 Document Reviewed: 05/29/2014 Elsevier Interactive Patient Education 2016 [employer] Paronychia Paronychia is an infection of the skin that surrounds a nail. It usually affects the skin around a fingernail, but it may also occur near a toenail. It often causes pain and swelling around the nail. This condition may come on suddenly or develop over a longer period. In some cases, a collection of pus (abscess) can form near or under the nail. Usually, paronychia is not serious and it clears up with treatment. CAUSES This condition may be caused by bacteria or fungi. It is commonly caused by either Streptococcus or Staphylococcus bacteria. The bacteria or fungi often cause the infection by getting into the affected area through an opening in the skin, such as a cut or a hangnail. RISK FACTORS This condition is more likely to develop in:  People who get their hands wet often, such as those who work as Designer, industrial/product, bartenders, or nurses.  People who bite their fingernails or suck their thumbs.  People who trim their nails too short.  People who have hangnails or injured fingertips.  People  who get manicures.  People who have diabetes. SYMPTOMS Symptoms of this condition include:  Redness and swelling of the skin near the nail.  Tenderness around the nail when you touch the area.  Pus-filled bumps under the cuticle. The cuticle is the skin at the base or sides of the nail.  Fluid or pus under the nail.  Throbbing pain in the area. DIAGNOSIS This condition is usually diagnosed with a physical exam. In some cases, a sample of pus may be taken from an abscess to be tested in a lab. This can help to determine what type of bacteria or fungi is causing the condition. TREATMENT Treatment for this condition depends on the cause and severity of the condition. If the condition is mild, it may clear up on its own in a few days. Your health care provider may recommend soaking the affected area in warm water a few times a day. When treatment is needed, the options may include:  Antibiotic medicine, if the condition is caused by a bacterial infection.  Antifungal medicine, if the condition is caused by a fungal infection.  Incision and drainage, if an abscess is present. In this procedure, the health care provider will cut open the abscess so the pus can drain out. HOME CARE INSTRUCTIONS  Soak the affected area in warm water if directed to do so by your health care provider. You may be told to do this for 20 minutes, 2-3 times a day. Keep the area dry in between soakings.  Take medicines only as directed by your health care provider.  If you were prescribed an antibiotic medicine, finish all of it even if you start to feel better.  Keep the affected area clean.  Do not try to drain a fluid-filled bump yourself.  If you will be washing dishes or performing other tasks that require your hands to get wet, wear rubber gloves. You should also wear gloves if your hands might come in contact with irritating substances, such as cleaners or chemicals.  Follow your health care provider's  instructions about:  Wound care.  Bandage (dressing) changes and removal. SEEK MEDICAL CARE IF:  Your symptoms get worse or do not improve with treatment.  You have a fever or chills.  You have redness spreading from the affected area.  You have continued or increased fluid, blood, or pus coming from the affected area.  Your finger or knuckle becomes swollen or is difficult  to move.   This information is not intended to replace advice given to you by your health care provider. Make sure you discuss any questions you have with your health care provider.   Document Released: 03/22/2001 Document Revised: 02/10/2015 Document Reviewed: 09/03/2014 Elsevier Interactive Patient Education 2016 [employer]. Cephalexin tablets or capsules What is this medicine? CEPHALEXIN (sef a LEX in) is a cephalosporin antibiotic. It is used to treat certain kinds of bacterial infections It will not work for colds, flu, or other viral infections. This medicine may be used for other purposes; ask your health care provider or pharmacist if you have questions. What should I tell my health care provider before I take this medicine? They need to know if you have any of these conditions: -kidney disease -stomach or intestine problems, especially colitis -an unusual or allergic reaction to cephalexin, other cephalosporins, penicillins, other antibiotics, medicines, foods, dyes or preservatives -pregnant or trying to get pregnant -breast-feeding How should I use this medicine? Take this medicine by mouth with a full glass of water. Follow the directions on the prescription label. This medicine can be taken with or without food. Take your medicine at regular intervals. Do not take your medicine more often than directed. Take all of your medicine as directed even if you think you are better. Do not skip doses or stop your medicine early. Talk to your pediatrician regarding the use of this medicine in children.  While this drug may be prescribed for selected conditions, precautions do apply. Overdosage: If you think you have taken too much of this medicine contact a poison control center or emergency room at once. NOTE: This medicine is only for you. Do not share this medicine with others. What if I miss a dose? If you miss a dose, take it as soon as you can. If it is almost time for your next dose, take only that dose. Do not take double or extra doses. There should be at least 4 to 6 hours between doses. What may interact with this medicine? -probenecid -some other antibiotics This list may not describe all possible interactions. Give your health care provider a list of all the medicines, herbs, non-prescription drugs, or dietary supplements you use. Also tell them if you smoke, drink alcohol, or use illegal drugs. Some items may interact with your medicine. What should I watch for while using this medicine? Tell your doctor or health care professional if your symptoms do not begin to improve in a few days. Do not treat diarrhea with over the counter products. Contact your doctor if you have diarrhea that lasts more than 2 days or if it is severe and watery. If you have diabetes, you may get a false-positive result for sugar in your urine. Check with your doctor or health care professional. What side effects may I notice from receiving this medicine? Side effects that you should report to your doctor or health care professional as soon as possible: -allergic reactions like skin rash, itching or hives, swelling of the face, lips, or tongue -breathing problems -pain or trouble passing urine -redness, blistering, peeling or loosening of the skin, including inside the mouth -severe or watery diarrhea -unusually weak or tired -yellowing of the eyes, skin Side effects that usually do not require medical attention (report to your doctor or health care professional if they continue or are bothersome): -gas  or heartburn -genital or anal irritation -headache -joint or muscle pain -nausea, vomiting This list may not describe all possible  side effects. Call your doctor for medical advice about side effects. You may report side effects to FDA at 1-800-FDA-1088. Where should I keep my medicine? Keep out of the reach of children. Store at room temperature between 59 and 86 degrees F (15 and 30 degrees C). Throw away any unused medicine after the expiration date. NOTE: This sheet is a summary. It may not cover all possible information. If you have questions about this medicine, talk to your doctor, pharmacist, or health care provider.    2016, Elsevier/[employer]. (2007-12-31 17:09:13)

## 2016-04-09 ENCOUNTER — Telehealth: Payer: Self-pay

## 2016-04-09 LAB — CBC WITH DIFFERENTIAL/PLATELET
Basophils Absolute: 0 10*3/uL (ref 0.0–0.2)
Basos: 1 %
EOS (ABSOLUTE): 0.1 10*3/uL (ref 0.0–0.4)
Eos: 2 %
Hematocrit: 40.4 % (ref 34.0–46.6)
Hemoglobin: 12.9 g/dL (ref 11.1–15.9)
IMMATURE GRANULOCYTES: 0 %
Immature Grans (Abs): 0 10*3/uL (ref 0.0–0.1)
LYMPHS: 27 %
Lymphocytes Absolute: 1.3 10*3/uL (ref 0.7–3.1)
MCH: 31.6 pg (ref 26.6–33.0)
MCHC: 31.9 g/dL (ref 31.5–35.7)
MCV: 99 fL — ABNORMAL HIGH (ref 79–97)
Monocytes Absolute: 0.3 10*3/uL (ref 0.1–0.9)
Monocytes: 7 %
Neutrophils Absolute: 3 10*3/uL (ref 1.4–7.0)
Neutrophils: 63 %
PLATELETS: 231 10*3/uL (ref 150–379)
RBC: 4.08 x10E6/uL (ref 3.77–5.28)
RDW: 14.2 % (ref 12.3–15.4)
WBC: 4.7 10*3/uL (ref 3.4–10.8)

## 2016-04-09 LAB — LIPID PANEL
Chol/HDL Ratio: 2.1 ratio units (ref 0.0–4.4)
Cholesterol, Total: 183 mg/dL (ref 100–199)
HDL: 86 mg/dL (ref >39)
LDL CALC: 86 mg/dL (ref 0–99)
TRIGLYCERIDES: 56 mg/dL (ref 0–149)
VLDL Cholesterol Cal: 11 mg/dL (ref 5–40)

## 2016-04-09 LAB — COMPREHENSIVE METABOLIC PANEL
ALT: 16 IU/L (ref 0–32)
AST: 17 IU/L (ref 0–40)
Albumin/Globulin Ratio: 1.9 (ref 1.2–2.2)
Albumin: 4.4 g/dL (ref 3.5–5.5)
Alkaline Phosphatase: 62 IU/L (ref 39–117)
BUN/Creatinine Ratio: 24 — ABNORMAL HIGH (ref 9–23)
BUN: 17 mg/dL (ref 6–24)
Bilirubin Total: 0.6 mg/dL (ref 0.0–1.2)
CO2: 24 mmol/L (ref 18–29)
Calcium: 9.6 mg/dL (ref 8.7–10.2)
Chloride: 103 mmol/L (ref 96–106)
Creatinine, Ser: 0.71 mg/dL (ref 0.57–1.00)
GFR, EST AFRICAN AMERICAN: 110 mL/min/{1.73_m2} (ref >59)
GFR, EST NON AFRICAN AMERICAN: 96 mL/min/{1.73_m2} (ref >59)
GLUCOSE: 76 mg/dL (ref 65–99)
Globulin, Total: 2.3 g/dL (ref 1.5–4.5)
Potassium: 4.1 mmol/L (ref 3.5–5.2)
Sodium: 141 mmol/L (ref 134–144)
Total Protein: 6.7 g/dL (ref 6.0–8.5)

## 2016-04-09 LAB — HEMOGLOBIN A1C
Est. average glucose Bld gHb Est-mCnc: 100 mg/dL
Hgb A1c MFr Bld: 5.1 % (ref 4.8–5.6)

## 2016-04-09 LAB — TSH: TSH: 0.436 u[IU]/mL — ABNORMAL LOW (ref 0.450–4.500)

## 2016-04-09 NOTE — Telephone Encounter (Signed)
-----   Message from Mar Daring, PA-C sent at 04/09/2016  9:15 AM EDT ----- All labs are within normal limits and stable. TSH was borderline low which may mean the synthroid dose is too high. I would like to recheck TSH in 3 months. I will place order and we can release for her to have rechecked in 3 months. Thanks! -JB

## 2016-04-09 NOTE — Telephone Encounter (Signed)
LMTCB  Thanks,  -Kalana Yust 

## 2016-04-09 NOTE — Addendum Note (Signed)
Addended by: Mar Daring on: 04/09/2016 09:17 AM   Modules accepted: Orders, SmartSet

## 2016-04-13 NOTE — Telephone Encounter (Signed)
LMTCB. sd  

## 2016-04-13 NOTE — Telephone Encounter (Signed)
Patient advised as directed below.  Thanks,  -Lavergne Hiltunen 

## 2016-04-13 NOTE — Telephone Encounter (Signed)
Pt returned call, please call back on her cell

## 2016-07-08 ENCOUNTER — Other Ambulatory Visit: Payer: Self-pay | Admitting: Physician Assistant

## 2016-07-08 DIAGNOSIS — Z1231 Encounter for screening mammogram for malignant neoplasm of breast: Secondary | ICD-10-CM

## 2016-08-04 ENCOUNTER — Ambulatory Visit
Admission: RE | Admit: 2016-08-04 | Discharge: 2016-08-04 | Disposition: A | Discharge status: Home / Self Care | Payer: BLUE CROSS/BLUE SHIELD | Source: Ambulatory Visit | Attending: Physician Assistant | Admitting: Physician Assistant

## 2016-08-04 ENCOUNTER — Telehealth: Payer: Self-pay

## 2016-08-04 DIAGNOSIS — Z1231 Encounter for screening mammogram for malignant neoplasm of breast: Secondary | ICD-10-CM | POA: Diagnosis not present

## 2016-08-04 NOTE — Telephone Encounter (Signed)
Patient advised as directed below.  Thanks,  -Joseline 

## 2016-08-04 NOTE — Telephone Encounter (Signed)
-----   Message from Mar Daring, [place] sent at 08/04/2016 11:32 AM EDT ----- Normal mammogram. Repeat screening in one year.

## 2016-09-29 ENCOUNTER — Encounter: Payer: Self-pay | Admitting: Physician Assistant

## 2016-09-29 ENCOUNTER — Ambulatory Visit (INDEPENDENT_AMBULATORY_CARE_PROVIDER_SITE_OTHER): Payer: BLUE CROSS/BLUE SHIELD | Admitting: Physician Assistant

## 2016-09-29 VITALS — BP 94/70 | HR 77 | Temp 98.0°F | Resp 16 | Wt 133.4 lb

## 2016-09-29 DIAGNOSIS — R059 Cough, unspecified: Secondary | ICD-10-CM

## 2016-09-29 DIAGNOSIS — J0100 Acute maxillary sinusitis, unspecified: Secondary | ICD-10-CM

## 2016-09-29 DIAGNOSIS — R05 Cough: Secondary | ICD-10-CM

## 2016-09-29 MED ORDER — AMOXICILLIN-POT CLAVULANATE 875-125 MG PO TABS: 1.0000 | ORAL_TABLET | Freq: Two times a day (BID) | ORAL | 0 refills | Status: DC

## 2016-09-29 MED ORDER — BENZONATATE 200 MG PO CAPS: 200.0000 mg | ORAL_CAPSULE | Freq: Three times a day (TID) | ORAL | 0 refills | Status: DC | PRN

## 2016-09-29 MED ORDER — HYDROCODONE-HOMATROPINE 5-1.5 MG/5ML PO SYRP
5.0000 mL | ORAL_SOLUTION | Freq: Three times a day (TID) | ORAL | 0 refills | Status: DC | PRN
Start: 2016-09-29 — End: 2016-12-07

## 2016-09-29 NOTE — Patient Instructions (Signed)

## 2016-09-29 NOTE — Progress Notes (Signed)
Patient: Julie Boyer Female    DOB: 1961/02/10   55 y.o.   MRN: [id number] Visit Date: 09/29/2016  Today's Provider: Mar Daring, PA-C   Chief Complaint  Patient presents with  . Cough   Subjective:    Cough  This is a new problem. The current episode started 1 to 4 weeks ago (2 weeks). The problem occurs constantly. The cough is productive of sputum. Associated symptoms include headaches, nasal congestion, postnasal drip, rhinorrhea, shortness of breath (a little) and wheezing (a little). Pertinent negatives include no chest pain, chills, ear pain, fever or sore throat. She has tried OTC cough suppressant for the symptoms.      Allergies  Allergen Reactions  . Azithromycin Other (See Comments)    Z-Pak: Red splotches     Current Outpatient Prescriptions:  .  BIOTIN FORTE PO, Take by mouth daily., Disp: , Rfl:  .  buPROPion (WELLBUTRIN SR) 150 MG 12 hr tablet, Take 1 tablet (150 mg total) by mouth 2 (two) times daily., Disp: 180 tablet, Rfl: 3 .  cetirizine (ZYRTEC) 10 MG tablet, Take 1 tablet by mouth daily., Disp: , Rfl:  .  Cyanocobalamin (VITAMIN B-12 PO), Take by mouth daily., Disp: , Rfl:  .  estradiol (ESTRACE) 0.5 MG tablet, Take 1 tablet by mouth daily., Disp: , Rfl: 11 .  Misc Natural Products (OSTEO BI-FLEX ADV DOUBLE ST PO), Take by mouth daily., Disp: , Rfl:  .  mometasone (NASONEX) 50 MCG/ACT nasal spray, Place 1 spray into the nose daily., Disp: , Rfl:  .  SYNTHROID 100 MCG tablet, Take 1 tablet by mouth daily., Disp: , Rfl: 11 .  benzonatate (TESSALON) 200 MG capsule, Take 1 capsule (200 mg total) by mouth 3 (three) times daily as needed for cough. (Patient not taking: Reported on 09/29/2016), Disp: 30 capsule, Rfl: 0 .  cephALEXin (KEFLEX) 500 MG capsule, Take 1 capsule (500 mg total) by mouth 2 (two) times daily. (Patient not taking: Reported on 09/29/2016), Disp: 14 capsule, Rfl: 0 .  moxifloxacin (AVELOX) 400 MG tablet, Take 1 tablet (400 mg  total) by mouth daily. (Patient not taking: Reported on 09/29/2016), Disp: 10 tablet, Rfl: 0  Review of Systems  Constitutional: Negative for chills, fatigue and fever.  HENT: Positive for congestion, postnasal drip, rhinorrhea, sinus pain and sinus pressure. Negative for ear pain, sneezing, sore throat, tinnitus and trouble swallowing.   Respiratory: Positive for cough, chest tightness, shortness of breath (a little) and wheezing (a little).   Cardiovascular: Negative for chest pain, palpitations and leg swelling.  Gastrointestinal: Negative for abdominal pain.  Neurological: Positive for headaches. Negative for dizziness.    Social History  Substance Use Topics  . Smoking status: Current Every Day Smoker    Packs/day: 1.00    Years: 20.00  . Smokeless tobacco: Never Used  . Alcohol use 3.6 oz/week    6 Shots of liquor per week     Comment: OCCASIONALLY   Objective:   BP 94/70 (BP Location: Right Arm, Patient Position: Sitting, Cuff Size: Normal)   Pulse 77   Temp 98 F (36.7 C) (Oral)   Resp 16   Wt 133 lb 6.4 oz (60.5 kg)   SpO2 98%   BMI 23.63 kg/m   Physical Exam  Constitutional: She appears well-developed and well-nourished. No distress.  HENT:  Head: Normocephalic and atraumatic.  Right Ear: Hearing, tympanic membrane, external ear and ear canal normal.  Left Ear: Hearing,  tympanic membrane, external ear and ear canal normal.  Nose: Mucosal edema present. Right sinus exhibits maxillary sinus tenderness. Right sinus exhibits no frontal sinus tenderness. Left sinus exhibits maxillary sinus tenderness. Left sinus exhibits no frontal sinus tenderness.  Mouth/Throat: Uvula is midline, oropharynx is clear and moist and mucous membranes are normal. No oropharyngeal exudate, posterior oropharyngeal edema or posterior oropharyngeal erythema.  Neck: Normal range of motion. Neck supple. No tracheal deviation present. No thyromegaly present.  Cardiovascular: Normal rate, regular  rhythm and normal heart sounds.  Exam reveals no gallop and no friction rub.   No murmur heard. Pulmonary/Chest: Effort normal and breath sounds normal. No stridor. No respiratory distress. She has no wheezes. She has no rales.  Lymphadenopathy:    She has no cervical adenopathy.  Skin: She is not diaphoretic.  Vitals reviewed.     Assessment & Plan:     1. Acute maxillary sinusitis, recurrence not specified Worsening symptoms that have not responded to OTC medications. Will give augmentin as below. Continue allergy medications. Stay well hydrated and get plenty of rest. Call if no symptom improvement or if symptoms worsen. - amoxicillin-clavulanate (AUGMENTIN) 875-125 MG tablet; Take 1 tablet by mouth 2 (two) times daily.  Dispense: 20 tablet; Refill: 0  2. Cough Worsening symptoms that has not responded to OTC medications. Will give Hycodan cough syrup as below for nighttime cough. Drowsiness precautions given to patient. Stay well hydrated. Use tessalon perles for daytime cough. - benzonatate (TESSALON) 200 MG capsule; Take 1 capsule (200 mg total) by mouth 3 (three) times daily as needed for cough.  Dispense: 30 capsule; Refill: 0 - HYDROcodone-homatropine (HYCODAN) 5-1.5 MG/5ML syrup; Take 5 mLs by mouth every 8 (eight) hours as needed.  Dispense: 180 mL; Refill: 0       Mar Daring, PA-C  [facility]

## 2016-12-07 ENCOUNTER — Ambulatory Visit (INDEPENDENT_AMBULATORY_CARE_PROVIDER_SITE_OTHER): Payer: BLUE CROSS/BLUE SHIELD | Admitting: Physician Assistant

## 2016-12-07 ENCOUNTER — Encounter: Payer: Self-pay | Admitting: Physician Assistant

## 2016-12-07 VITALS — BP 90/60 | HR 70 | Temp 97.8°F | Resp 16 | Wt 137.6 lb

## 2016-12-07 DIAGNOSIS — J069 Acute upper respiratory infection, unspecified: Secondary | ICD-10-CM | POA: Diagnosis not present

## 2016-12-07 DIAGNOSIS — R05 Cough: Secondary | ICD-10-CM

## 2016-12-07 DIAGNOSIS — M62830 Muscle spasm of back: Secondary | ICD-10-CM | POA: Diagnosis not present

## 2016-12-07 DIAGNOSIS — R059 Cough, unspecified: Secondary | ICD-10-CM

## 2016-12-07 MED ORDER — CYCLOBENZAPRINE HCL 5 MG PO TABS: 5.0000 mg | ORAL_TABLET | Freq: Every day | ORAL | 1 refills | Status: DC

## 2016-12-07 MED ORDER — BENZONATATE 200 MG PO CAPS: 200.0000 mg | ORAL_CAPSULE | Freq: Three times a day (TID) | ORAL | 1 refills | Status: DC | PRN

## 2016-12-07 MED ORDER — DOXYCYCLINE HYCLATE 100 MG PO TABS: 100.0000 mg | ORAL_TABLET | Freq: Two times a day (BID) | ORAL | 0 refills | Status: DC

## 2016-12-07 NOTE — Patient Instructions (Signed)
Muscle Cramps and Spasms Muscle cramps and spasms occur when a muscle or muscles tighten and you have no control over this tightening (involuntary muscle contraction). They are a common problem and can develop in any muscle. The most common place is in the calf muscles of the leg. Muscle cramps and muscle spasms are both involuntary muscle contractions, but there are some differences between the two:  Muscle cramps are painful. They come and go and may last a few seconds to 15 minutes. Muscle cramps are often more forceful and last longer than muscle spasms.  Muscle spasms may or may not be painful. They may also last just a few seconds or much longer. Certain medical conditions, such as diabetes or Parkinson disease, can make it more likely to develop cramps or spasms. However, cramps or spasms are usually not caused by a serious underlying problem. Common causes include:  Overexertion.  Overuse from repetitive motions, or doing the same thing over and over.  Remaining in a certain position for a long period of time.  Improper preparation, form, or technique while playing a sport or doing an activity.  Dehydration.  Injury.  Side effects of some medicines.  Abnormally low levels of the salts and ions in your blood (electrolytes), especially potassium and calcium. This could happen if you are taking water pills (diuretics) or if you are pregnant. In many cases, the cause of muscle cramps or spasms is unknown. Follow these instructions at home:  Stay well hydrated. Drink enough fluid to keep your urine clear or pale yellow.  Try massaging, stretching, and relaxing the affected muscle.  If directed, apply heat to tight or tense muscles as often as told by your health care provider. Use the heat source that your health care provider recommends, such as a moist heat pack or a heating pad.  Place a towel between your skin and the heat source.  Leave the heat on for 20-30  minutes.  Remove the heat if your skin turns bright red. This is especially important if you are unable to feel pain, heat, or cold. You may have a greater risk of getting burned.  If directed, put ice on the affected area. This may help if you are sore or have pain after a cramp or spasm.  Put ice in a plastic bag.  Place a towel between your skin and the bag.  Leavethe ice on for 20 minutes, 2-3 times a day.  Take over-the-counter and prescription medicines only as told by your health care provider.  Pay attention to any changes in your symptoms. Contact a health care provider if:  Your cramps or spasms get more severe or happen more often.  Your cramps or spasms do not improve over time. This information is not intended to replace advice given to you by your health care provider. Make sure you discuss any questions you have with your health care provider. Document Released: 03/18/2002 Document Revised: 10/28/2015 Document Reviewed: 06/30/2015 Elsevier Interactive Patient Education  2017 [employer]. Doxycycline tablets or capsules What is this medicine? DOXYCYCLINE (dox i SYE kleen) is a tetracycline antibiotic. It kills certain bacteria or stops their growth. It is used to treat many kinds of infections, like dental, skin, respiratory, and urinary tract infections. It also treats acne, Lyme disease, malaria, and certain sexually transmitted infections. This medicine may be used for other purposes; ask your health care provider or pharmacist if you have questions. COMMON BRAND NAME(S): Acticlate, Adoxa, Adoxa CK, Adoxa Pak, Adoxa  TT, Alodox, Avidoxy, Doxal, Mondoxyne NL, Monodox, Morgidox 1x, Morgidox 1x Kit, Morgidox 2x, Morgidox 2x Kit, NutriDox, Ocudox, TARGADOX, Vibra-Tabs, Vibramycin What should I tell my health care provider before I take this medicine? They need to know if you have any of these conditions: -liver disease -long exposure to sunlight like working  outdoors -stomach problems like colitis -an unusual or allergic reaction to doxycycline, tetracycline antibiotics, other medicines, foods, dyes, or preservatives -pregnant or trying to get pregnant -breast-feeding How should I use this medicine? Take this medicine by mouth with a full glass of water. Follow the directions on the prescription label. It is best to take this medicine without food, but if it upsets your stomach take it with food. Take your medicine at regular intervals. Do not take your medicine more often than directed. Take all of your medicine as directed even if you think you are better. Do not skip doses or stop your medicine early. Talk to your pediatrician regarding the use of this medicine in children. While this drug may be prescribed for selected conditions, precautions do apply. Overdosage: If you think you have taken too much of this medicine contact a poison control center or emergency room at once. NOTE: This medicine is only for you. Do not share this medicine with others. What if I miss a dose? If you miss a dose, take it as soon as you can. If it is almost time for your next dose, take only that dose. Do not take double or extra doses. What may interact with this medicine? -antacids -barbiturates -birth control pills -bismuth subsalicylate -carbamazepine -methoxyflurane -other antibiotics -phenytoin -vitamins that contain iron -warfarin This list may not describe all possible interactions. Give your health care provider a list of all the medicines, herbs, non-prescription drugs, or dietary supplements you use. Also tell them if you smoke, drink alcohol, or use illegal drugs. Some items may interact with your medicine. What should I watch for while using this medicine? Tell your doctor or health care professional if your symptoms do not improve. Do not treat diarrhea with over the counter products. Contact your doctor if you have diarrhea that lasts more than 2  days or if it is severe and watery. Do not take this medicine just before going to bed. It may not dissolve properly when you lay down and can cause pain in your throat. Drink plenty of fluids while taking this medicine to also help reduce irritation in your throat. This medicine can make you more sensitive to the sun. Keep out of the sun. If you cannot avoid being in the sun, wear protective clothing and use sunscreen. Do not use sun lamps or tanning beds/booths. Birth control pills may not work properly while you are taking this medicine. Talk to your doctor about using an extra method of birth control. If you are being treated for a sexually transmitted infection, avoid sexual contact until you have finished your treatment. Your sexual partner may also need treatment. Avoid antacids, aluminum, calcium, magnesium, and iron products for 4 hours before and 2 hours after taking a dose of this medicine. If you are using this medicine to prevent malaria, you should still protect yourself from contact with mosquitos. Stay in screened-in areas, use mosquito nets, keep your body covered, and use an insect repellent. What side effects may I notice from receiving this medicine? Side effects that you should report to your doctor or health care professional as soon as possible: -allergic reactions like skin rash,  itching or hives, swelling of the face, lips, or tongue -difficulty breathing -fever -itching in the rectal or genital area -pain on swallowing -redness, blistering, peeling or loosening of the skin, including inside the mouth -severe stomach pain or cramps -unusual bleeding or bruising -unusually weak or tired -yellowing of the eyes or skin Side effects that usually do not require medical attention (report to your doctor or health care professional if they continue or are bothersome): -diarrhea -loss of appetite -nausea, vomiting This list may not describe all possible side effects. Call your  doctor for medical advice about side effects. You may report side effects to FDA at 1-800-FDA-1088. Where should I keep my medicine? Keep out of the reach of children. Store at room temperature, below 30 degrees C (86 degrees F). Protect from light. Keep container tightly closed. Throw away any unused medicine after the expiration date. Taking this medicine after the expiration date can make you seriously ill. NOTE: This sheet is a summary. It may not cover all possible information. If you have questions about this medicine, talk to your doctor, pharmacist, or health care provider.  2018 Elsevier/Gold Standard (2015-10-28 17:11:22)

## 2016-12-07 NOTE — Progress Notes (Signed)
Patient: Julie Boyer Female    DOB: 1961-03-03   56 y.o.   MRN: [id number] Visit Date: 12/07/2016  Today's Provider: Mar Daring, PA-C   Chief Complaint  Patient presents with  . URI   Subjective:    URI   This is a recurrent problem. The problem has been unchanged. There has been no fever. Associated symptoms include congestion, coughing, headaches, a plugged ear sensation, sinus pain and sneezing. Pertinent negatives include no abdominal pain, chest pain, diarrhea, ear pain, rhinorrhea, sore throat, vomiting or wheezing. She has tried decongestant (She was prescribed antibiotic at [facility]) for the symptoms. The treatment provided mild relief.     Depression screen PHQ 2/9 12/07/2016  Decreased Interest 2  Down, Depressed, Hopeless 2  PHQ - 2 Score 4  Altered sleeping 1  Tired, decreased energy 2  Change in appetite -  Feeling bad or failure about yourself  3  Trouble concentrating 0  Moving slowly or fidgety/restless 0  Suicidal thoughts 0  PHQ-9 Score 10  Difficult doing work/chores Somewhat difficult   She also has new onset back pain that started yesterday when she woke up. It is located on the left lower back. No injury. She has not been lifting or doing activities out of a normal routine. No radiating symptoms. No numbness or tingling. Aggravated when she goes from a sitting to standing. Standing feels better. She has not tried anything to relieve the pain.     Allergies  Allergen Reactions  . Azithromycin Other (See Comments)    Z-Pak: Red splotches     Current Outpatient Prescriptions:  .  BIOTIN FORTE PO, Take by mouth daily., Disp: , Rfl:  .  buPROPion (WELLBUTRIN SR) 150 MG 12 hr tablet, Take 1 tablet (150 mg total) by mouth 2 (two) times daily., Disp: 180 tablet, Rfl: 3 .  cetirizine (ZYRTEC) 10 MG tablet, Take 1 tablet by mouth daily., Disp: , Rfl:  .  Cyanocobalamin (VITAMIN B-12 PO), Take by mouth daily., Disp: , Rfl:  .  estradiol  (ESTRACE) 0.5 MG tablet, Take 1 tablet by mouth daily., Disp: , Rfl: 11 .  Misc Natural Products (OSTEO BI-FLEX ADV DOUBLE ST PO), Take by mouth daily., Disp: , Rfl:  .  mometasone (NASONEX) 50 MCG/ACT nasal spray, Place 1 spray into the nose daily., Disp: , Rfl:  .  SYNTHROID 100 MCG tablet, Take 1 tablet by mouth daily., Disp: , Rfl: 11 .  amoxicillin-clavulanate (AUGMENTIN) 875-125 MG tablet, Take 1 tablet by mouth 2 (two) times daily. (Patient not taking: Reported on 12/07/2016), Disp: 20 tablet, Rfl: 0 .  benzonatate (TESSALON) 200 MG capsule, Take 1 capsule (200 mg total) by mouth 3 (three) times daily as needed for cough. (Patient not taking: Reported on 12/07/2016), Disp: 30 capsule, Rfl: 0 .  HYDROcodone-homatropine (HYCODAN) 5-1.5 MG/5ML syrup, Take 5 mLs by mouth every 8 (eight) hours as needed. (Patient not taking: Reported on 12/07/2016), Disp: 180 mL, Rfl: 0  Review of Systems  Constitutional: Negative for fatigue and fever.  HENT: Positive for congestion, nosebleeds, postnasal drip, sinus pain and sneezing. Negative for ear pain, rhinorrhea, sore throat and trouble swallowing.   Respiratory: Positive for cough. Negative for chest tightness, shortness of breath and wheezing.   Cardiovascular: Negative for chest pain, palpitations and leg swelling.  Gastrointestinal: Negative for abdominal pain, diarrhea and vomiting.  Neurological: Positive for headaches.    Social History  Substance Use Topics  .  Smoking status: Current Every Day Smoker    Packs/day: 1.00    Years: 20.00  . Smokeless tobacco: Never Used  . Alcohol use 3.6 oz/week    6 Shots of liquor per week     Comment: OCCASIONALLY   Objective:   BP 90/60 (BP Location: Right Arm, Patient Position: Sitting, Cuff Size: Normal)   Pulse 70   Temp 97.8 F (36.6 C) (Oral)   Resp 16   Wt 137 lb 9.6 oz (62.4 kg)   BMI 24.37 kg/m   Physical Exam  Constitutional: She appears well-developed and well-nourished. No distress.    HENT:  Head: Normocephalic and atraumatic.  Right Ear: Hearing, tympanic membrane, external ear and ear canal normal.  Left Ear: Hearing, tympanic membrane, external ear and ear canal normal.  Nose: Nose normal.  Mouth/Throat: Uvula is midline, oropharynx is clear and moist and mucous membranes are normal. No oropharyngeal exudate, posterior oropharyngeal edema or posterior oropharyngeal erythema.  Eyes: Conjunctivae are normal. Pupils are equal, round, and reactive to light. Right eye exhibits no discharge. Left eye exhibits no discharge. No scleral icterus.  Neck: Normal range of motion. Neck supple. No tracheal deviation present. No thyromegaly present.  Cardiovascular: Normal rate, regular rhythm and normal heart sounds.  Exam reveals no gallop and no friction rub.   No murmur heard. Pulmonary/Chest: Effort normal and breath sounds normal. No stridor. No respiratory distress. She has no wheezes. She has no rales.  Lymphadenopathy:    She has no cervical adenopathy.  Skin: Skin is warm and dry. She is not diaphoretic.  Vitals reviewed.      Assessment & Plan:     1. Upper respiratory tract infection, unspecified type Worsening. Patient states symptoms never truly improved from when she was seen at the end of December. Will try doxycycline as below. She is to call if no improvement. May consider referral back to Dr. Kathyrn Sheriff since symptoms are persistent. - doxycycline (VIBRA-TABS) 100 MG tablet; Take 1 tablet (100 mg total) by mouth 2 (two) times daily.  Dispense: 20 tablet; Refill: 0  2. Cough Tessalon perles worked well before. Will give refill as below. Push fluids. - benzonatate (TESSALON) 200 MG capsule; Take 1 capsule (200 mg total) by mouth 3 (three) times daily as needed for cough.  Dispense: 30 capsule; Refill: 1  3. Muscle spasm of back Muscle spasm noted in paraspinal muscle on left with trigger point. Flexeril given as below. Advised patient may use moist heating pad.  Also may use icy hot or biofreeze and massage area of trigger point. She is to call if no improvement. - cyclobenzaprine (FLEXERIL) 5 MG tablet; Take 1-2 tablets (5-10 mg total) by mouth at bedtime.  Dispense: 30 tablet; Refill: [place], PA-C  [facility]

## 2017-02-16 ENCOUNTER — Ambulatory Visit (INDEPENDENT_AMBULATORY_CARE_PROVIDER_SITE_OTHER): Payer: BLUE CROSS/BLUE SHIELD | Admitting: Physician Assistant

## 2017-02-16 ENCOUNTER — Encounter: Payer: Self-pay | Admitting: Physician Assistant

## 2017-02-16 VITALS — BP 112/70 | HR 80 | Temp 98.9°F | Resp 16 | Wt 141.0 lb

## 2017-02-16 DIAGNOSIS — J40 Bronchitis, not specified as acute or chronic: Secondary | ICD-10-CM | POA: Diagnosis not present

## 2017-02-16 MED ORDER — HYDROCODONE-HOMATROPINE 5-1.5 MG/5ML PO SYRP: 5.0000 mL | ORAL_SOLUTION | Freq: Three times a day (TID) | ORAL | 0 refills | Status: DC | PRN

## 2017-02-16 MED ORDER — AMOXICILLIN-POT CLAVULANATE 875-125 MG PO TABS: 1.0000 | ORAL_TABLET | Freq: Two times a day (BID) | ORAL | 0 refills | Status: DC

## 2017-02-16 MED ORDER — PREDNISONE 10 MG (21) PO TBPK: ORAL_TABLET | ORAL | 0 refills | Status: DC

## 2017-02-16 NOTE — Patient Instructions (Signed)

## 2017-02-16 NOTE — Progress Notes (Signed)
Patient: Julie Boyer Female    DOB: [birth year]   56 y.o.   MRN: [id number] Visit Date: 02/16/2017  Today's Provider: Mar Daring, PA-C   Chief Complaint  Patient presents with  . URI   Subjective:    URI   This is a new problem. Episode onset: x 1 week. The problem has been gradually worsening. The maximum temperature recorded prior to her arrival was 101 - 101.9 F. Associated symptoms include chest pain (with cough), congestion, coughing (sometimes productive; yellow-green), ear pain (right), headaches, rhinorrhea, sneezing and a sore throat. Pertinent negatives include no abdominal pain, diarrhea, dysuria, nausea, neck pain, plugged ear sensation, sinus pain, swollen glands, vomiting or wheezing. Treatments tried: Zyrtec, Nasacort, Tessalon Perles, Delsym, Tylenol Cold, cough drops. The treatment provided no relief.  Pt reports the cough is keep her awake at night.     Allergies  Allergen Reactions  . Azithromycin Other (See Comments)    Z-Pak: Red splotches     Current Outpatient Prescriptions:  .  benzonatate (TESSALON) 200 MG capsule, Take 1 capsule (200 mg total) by mouth 3 (three) times daily as needed for cough., Disp: 30 capsule, Rfl: 1 .  BIOTIN FORTE PO, Take by mouth daily., Disp: , Rfl:  .  buPROPion (WELLBUTRIN SR) 150 MG 12 hr tablet, Take 1 tablet (150 mg total) by mouth 2 (two) times daily., Disp: 180 tablet, Rfl: 3 .  cetirizine (ZYRTEC) 10 MG tablet, Take 1 tablet by mouth daily., Disp: , Rfl:  .  Cyanocobalamin (VITAMIN B-12 PO), Take 1 tablet by mouth every 3 (three) days. , Disp: , Rfl:  .  estradiol (ESTRACE) 0.5 MG tablet, Take 1 tablet by mouth daily., Disp: , Rfl: 11 .  Misc Natural Products (OSTEO BI-FLEX ADV DOUBLE ST PO), Take by mouth daily., Disp: , Rfl:  .  SYNTHROID 100 MCG tablet, Take 1 tablet by mouth daily., Disp: , Rfl: 11 .  triamcinolone (NASACORT ALLERGY 24HR) 55 MCG/ACT AERO nasal inhaler, Place 2 sprays into the nose  daily., Disp: , Rfl:  .  cyclobenzaprine (FLEXERIL) 5 MG tablet, Take 1-2 tablets (5-10 mg total) by mouth at bedtime. (Patient not taking: Reported on 02/16/2017), Disp: 30 tablet, Rfl: 1  Review of Systems  Constitutional: Positive for fatigue and fever. Negative for chills.  HENT: Positive for congestion, ear pain (right), postnasal drip, rhinorrhea, sinus pressure, sneezing and sore throat. Negative for ear discharge, hearing loss, sinus pain, trouble swallowing and voice change.   Respiratory: Positive for cough (sometimes productive; yellow-green). Negative for chest tightness, shortness of breath and wheezing.   Cardiovascular: Positive for chest pain (with cough). Negative for palpitations and leg swelling.  Gastrointestinal: Negative for abdominal pain, diarrhea, nausea and vomiting.  Genitourinary: Negative for dysuria.  Musculoskeletal: Negative for neck pain.  Neurological: Positive for headaches. Negative for dizziness.    Social History  Substance Use Topics  . Smoking status: Current Every Day Smoker    Packs/day: 1.00    Years: 20.00  . Smokeless tobacco: Never Used  . Alcohol use 3.6 oz/week    6 Shots of liquor per week     Comment: OCCASIONALLY   Objective:   BP 112/70 (BP Location: Right Arm, Patient Position: Sitting, Cuff Size: Normal)   Pulse 80   Temp 98.9 F (37.2 C) (Oral) Comment: pt took IBU this am  Resp 16   Wt 141 lb (64 kg)   SpO2 99%   BMI  24.98 kg/m  Vitals:   02/16/17 0805  BP: 112/70  Pulse: 80  Resp: 16  Temp: 98.9 F (37.2 C)  TempSrc: Oral  SpO2: 99%  Weight: 141 lb (64 kg)     Physical Exam  Constitutional: She appears well-developed and well-nourished. No distress.  HENT:  Head: Normocephalic and atraumatic.  Right Ear: Hearing, tympanic membrane, external ear and ear canal normal.  Left Ear: Hearing, tympanic membrane, external ear and ear canal normal.  Nose: Nose normal.  Mouth/Throat: Uvula is midline, oropharynx is  clear and moist and mucous membranes are normal. No oropharyngeal exudate.  Eyes: Conjunctivae are normal. Pupils are equal, round, and reactive to light. Right eye exhibits no discharge. Left eye exhibits no discharge. No scleral icterus.  Neck: Normal range of motion. Neck supple. No tracheal deviation present. No thyromegaly present.  Cardiovascular: Normal rate, regular rhythm and normal heart sounds.  Exam reveals no gallop and no friction rub.   No murmur heard. Pulmonary/Chest: Effort normal. No stridor. No respiratory distress. She has decreased breath sounds. She has no wheezes. She has no rales.  Lymphadenopathy:    She has no cervical adenopathy.  Skin: Skin is warm and dry. She is not diaphoretic.  Vitals reviewed.       Assessment & Plan:     1. Bronchitis Worsening symptoms that have not responded to OTC medications. Will give augmentin and prednisone as below. Continue allergy medications.  Will give Hycodan cough syrup as below for nighttime cough. Drowsiness precautions given to patient. Stay well hydrated. Use delsym, robitussin OR mucinex for daytime cough. Call if no symptom improvement or if symptoms worsen. - amoxicillin-clavulanate (AUGMENTIN) 875-125 MG tablet; Take 1 tablet by mouth 2 (two) times daily.  Dispense: 20 tablet; Refill: 0 - predniSONE (STERAPRED UNI-PAK 21 TAB) 10 MG (21) TBPK tablet; Take as directed on package instructions  Dispense: 21 tablet; Refill: 0 - HYDROcodone-homatropine (HYCODAN) 5-1.5 MG/5ML syrup; Take 5 mLs by mouth every 8 (eight) hours as needed for cough.  Dispense: 180 mL; Refill: 0       Mar Daring, PA-C  [facility]

## 2017-03-29 ENCOUNTER — Other Ambulatory Visit: Payer: Self-pay | Admitting: Physician Assistant

## 2017-03-29 DIAGNOSIS — F32A Depression, unspecified: Secondary | ICD-10-CM

## 2017-03-29 DIAGNOSIS — F329 Major depressive disorder, single episode, unspecified: Secondary | ICD-10-CM

## 2017-08-16 ENCOUNTER — Encounter: Payer: Self-pay | Admitting: Maternal Newborn

## 2017-08-16 ENCOUNTER — Ambulatory Visit (INDEPENDENT_AMBULATORY_CARE_PROVIDER_SITE_OTHER): Payer: BLUE CROSS/BLUE SHIELD | Admitting: Maternal Newborn

## 2017-08-16 VITALS — BP 100/80 | HR 54 | Ht 63.0 in | Wt 138.0 lb

## 2017-08-16 DIAGNOSIS — F17210 Nicotine dependence, cigarettes, uncomplicated: Secondary | ICD-10-CM | POA: Diagnosis not present

## 2017-08-16 DIAGNOSIS — Z716 Tobacco abuse counseling: Secondary | ICD-10-CM | POA: Diagnosis not present

## 2017-08-16 DIAGNOSIS — Z1272 Encounter for screening for malignant neoplasm of vagina: Secondary | ICD-10-CM | POA: Diagnosis not present

## 2017-08-16 DIAGNOSIS — Z01419 Encounter for gynecological examination (general) (routine) without abnormal findings: Secondary | ICD-10-CM | POA: Diagnosis not present

## 2017-08-16 MED ORDER — ESTRADIOL 0.5 MG PO TABS: 0.5000 mg | ORAL_TABLET | Freq: Every day | ORAL | 11 refills | Status: DC

## 2017-08-16 NOTE — Progress Notes (Signed)
Gynecology Annual Exam  PCP: Mar Daring, PA-C  Chief Complaint:  Chief Complaint  Patient presents with  . Gynecologic Exam    History of Present Illness:Patient is a 56 y.o. [id number] presents for annual exam. The patient has no complaints today.  Postmenopausal Bleeding: no Continues on Estrace for vasomotor symptoms with good control.  The patient is not currently sexually active. She denies dyspareunia.  The patient does not perform self breast exams.  There is notable family history of breast or ovarian cancer in her family and she had genetic screening ([facility]) in 2014. No mutations were detected in BRCA1 or BRCA2.  The patient wears seatbelts: yes.   The patient has regular exercise: yes.    The patient denies current symptoms of depression.     Review of Systems  Constitutional: Negative for fever and weight loss.  HENT: Positive for congestion. Negative for ear pain and sore throat.   Eyes: Negative for blurred vision, pain and redness.  Respiratory: Negative for cough, shortness of breath and wheezing.   Cardiovascular: Negative for chest pain and palpitations.  Gastrointestinal: Positive for constipation. Negative for abdominal pain and heartburn.  Genitourinary: Negative for dysuria, frequency and urgency.  Musculoskeletal: Positive for joint pain.  Skin: Negative for itching and rash.  Neurological: Negative for dizziness, weakness and headaches.  Endo/Heme/Allergies: Negative.   Psychiatric/Behavioral: Negative for depression. The patient is not nervous/anxious.   All other systems reviewed and are negative.   Past Medical History:  Past Medical History:  Diagnosis Date  . Arthritis    hands  . Complication of anesthesia    sneezing after last colonoscopy  . Endometriosis    uterus  . Family history of breast cancer    pt is BRCA neg  . Genetic testing of female    BRCA neg  . History of mammogram 08/31/2011; 03/18/15   neg; benign  .  History of Papanicolaou smear of cervix 06/22/11; 03/18/15   NEG; NEG  . Numbness and tingling in hands    pt thinks may be Carpal Tunnel issues  . Osteopenia 07/2015   spine - DR. MORAYATI  . Thyroid disorder   . Wears dentures    partial lower    Past Surgical History:  Past Surgical History:  Procedure Laterality Date  . ABDOMINAL HYSTERECTOMY  11/171992   tab, bso - BVD  . COLONOSCOPY  05/21/2010   benign appearing polyp was found in the rectum  . KNEE SURGERY Right 03/2001; 03/2013; 2011   arthroscopic  . TUBAL LIGATION  10/01/1989    Gynecologic History:  No LMP recorded. Patient has had a hysterectomy. Last Pap: 03/18/2015 Results were: NIL.   Last mammogram: 03/18/2015  Results were: BI-RAD II Obstetric History: [id number]  Family History:  Family History  Problem Relation Age of Onset  . Prostate cancer Father 66  . Bone cancer Father   . Cancer Father 2       KIDNEY  . Hypertension Father   . Breast cancer Sister 87       has had breast cancer twice  . Diabetes Mother        TYPE 2  . Alzheimer's disease Mother   . Cancer Brother 63       KIDNEY  . Lung cancer Brother 4  . Breast cancer Maternal Aunt 80    Social History:  Social History   Socioeconomic History  . Marital status: Single    Spouse name: Not on  file  . Number of children: 3  . Years of education: Not on file  . Highest education level: Not on file  Social Needs  . Financial resource strain: Not on file  . Food insecurity - worry: Not on file  . Food insecurity - inability: Not on file  . Transportation needs - medical: Not on file  . Transportation needs - non-medical: Not on file  Occupational History  . Occupation: BUSINESS  Tobacco Use  . Smoking status: Current Every Day Smoker    Packs/day: 1.00    Years: 20.00    Pack years: 20.00  . Smokeless tobacco: Never Used  Substance and Sexual Activity  . Alcohol use: Yes    Alcohol/week: 3.6 oz    Types: 6 Shots of liquor per week     Comment: OCCASIONALLY  . Drug use: No  . Sexual activity: Yes    Birth control/protection: Surgical  Other Topics Concern  . Not on file  Social History Narrative  . Not on file    Allergies:  Allergies  Allergen Reactions  . Azithromycin Other (See Comments)    Z-Pak: Red splotches    Medications: Prior to Admission medications   Medication Sig Start Date End Date Taking? Authorizing Provider  BIOTIN FORTE PO Take by mouth daily.   Yes [provider]  buPROPion (WELLBUTRIN SR) 150 MG 12 hr tablet TAKE 1 TABLET (150 MG TOTAL) BY MOUTH 2 (TWO) TIMES DAILY. 03/29/17  Yes Mar Daring, PA-C  cetirizine (ZYRTEC) 10 MG tablet Take 1 tablet by mouth daily.   Yes [provider]  Cyanocobalamin (VITAMIN B-12 PO) Take 1 tablet by mouth every 3 (three) days.    Yes [provider]  estradiol (ESTRACE) 0.5 MG tablet Take 1 tablet by mouth daily. 03/18/15  Yes [provider]  Misc Natural Products (OSTEO BI-FLEX ADV DOUBLE ST PO) Take by mouth daily.   Yes [provider]  SYNTHROID 100 MCG tablet Take 1 tablet by mouth daily. 03/17/15  Yes [provider]  triamcinolone (NASACORT ALLERGY 24HR) 55 MCG/ACT AERO nasal inhaler Place 2 sprays into the nose daily.   Yes [provider]    Physical Exam Vitals: Blood pressure 100/80, pulse (!) 54, height _0  (1.6 m), weight 138 lb (62.6 kg).  General: NAD HEENT: normocephalic, anicteric Thyroid: no enlargement, no palpable nodules Pulmonary: No increased work of breathing, CTAB Cardiovascular: RRR, distal pulses 2+ Breast: Breasts symmetrical, no tenderness, no palpable nodules or masses, no skin or nipple retraction present, no nipple discharge.  No axillary or supraclavicular lymphadenopathy. Abdomen: NABS, soft, non-tender, non-distended.  Umbilicus without lesions.  No hepatomegaly, splenomegaly or masses palpable. No evidence of hernia  Genitourinary:  External:  Normal external female genitalia.  Normal urethral meatus, normal  Bartholin's and Skene's glands.    Vagina: Normal vaginal mucosa, no evidence of prolapse.    Cervix: Surgically absent  Uterus: Surgically absent  Adnexa: ovaries surgically absent, no adnexal masses  Rectal: deferred  Lymphatic: no evidence of inguinal lymphadenopathy Extremities: no edema, erythema, or tenderness Neurologic: Grossly intact Psychiatric: mood appropriate, affect full   Assessment: 56 y.o. [id number] routine annual exam.  Plan: Problem List Items Addressed This Visit    None    Visit Diagnoses    Encounter for annual routine gynecological examination    -  Primary   Relevant Orders   MM DIGITAL SCREENING BILATERAL   Pap smear of vagina following gynecologic surgery  Relevant Orders   Pap IG and HPV (high risk) DNA detection   Tobacco abuse counseling       Tobacco dependence due to cigarettes          1) Mammogram - recommend yearly screening mammogram.  Mammogram was ordered today and patient aware to make an appointment with [facility] Poli at [facility] location.  2) STI screening was offered and declined.  3) ASCCP guidelines and rationale discussed.  Patient opts for every 3 years screening interval.  4) Osteoporosis: She had bone density testing in 2012 and was found to be osteopenic. She will have a DEXA scan at 64.  5) Routine healthcare maintenance including cholesterol, diabetes screening discussed; managed by PCP.  6) Colonoscopy done 2016, next due 2019 because of benign polyp findings.   7) Currently smoking 9 cigarettes daily. Advised to quit or cut down. Patient is not ready to quit and does not wish to try patches, gum, or medication to help with cessation.  8) Follow up 1 year for routine annual.  Avel Sensor, CNM 08/16/2017  1:49 PM

## 2017-08-18 LAB — PAP IG AND HPV HIGH-RISK
HPV, HIGH-RISK: NEGATIVE
PAP SMEAR COMMENT: 0

## 2017-08-29 ENCOUNTER — Encounter: Payer: Self-pay | Admitting: Obstetrics and Gynecology

## 2017-09-19 ENCOUNTER — Telehealth: Payer: Self-pay | Admitting: Physician Assistant

## 2017-09-19 DIAGNOSIS — F329 Major depressive disorder, single episode, unspecified: Secondary | ICD-10-CM

## 2017-09-19 DIAGNOSIS — F32A Depression, unspecified: Secondary | ICD-10-CM

## 2017-09-19 NOTE — Telephone Encounter (Signed)
LOV 02/16/2017. Last refill 03/29/2017.

## 2017-09-19 NOTE — Telephone Encounter (Signed)
[facility] Blow faxed refill request for following medications: buPROPion (WELLBUTRIN SR) 150 MG 12 hr tablet.90 day supply supply requested    Please advise,Thanks [place]

## 2017-09-21 MED ORDER — BUPROPION HCL ER (SR) 150 MG PO TB12: 150.0000 mg | ORAL_TABLET | Freq: Two times a day (BID) | ORAL | 3 refills | Status: DC

## 2017-09-21 NOTE — Telephone Encounter (Signed)
refilled 

## 2017-10-05 ENCOUNTER — Encounter: Payer: Self-pay | Admitting: Physician Assistant

## 2017-10-05 ENCOUNTER — Ambulatory Visit: Payer: BLUE CROSS/BLUE SHIELD | Admitting: Physician Assistant

## 2017-10-05 VITALS — BP 108/72 | HR 64 | Temp 97.8°F | Resp 16 | Ht 63.0 in | Wt 138.0 lb

## 2017-10-05 DIAGNOSIS — R059 Cough, unspecified: Secondary | ICD-10-CM

## 2017-10-05 DIAGNOSIS — R350 Frequency of micturition: Secondary | ICD-10-CM | POA: Diagnosis not present

## 2017-10-05 DIAGNOSIS — J0140 Acute pansinusitis, unspecified: Secondary | ICD-10-CM | POA: Diagnosis not present

## 2017-10-05 DIAGNOSIS — N3000 Acute cystitis without hematuria: Secondary | ICD-10-CM | POA: Diagnosis not present

## 2017-10-05 DIAGNOSIS — R05 Cough: Secondary | ICD-10-CM | POA: Diagnosis not present

## 2017-10-05 LAB — POCT URINALYSIS DIPSTICK
BILIRUBIN UA: NEGATIVE
Glucose, UA: NEGATIVE
Ketones, UA: NEGATIVE
Nitrite, UA: NEGATIVE
Protein, UA: NEGATIVE
Spec Grav, UA: 1.025 (ref 1.010–1.025)
Urobilinogen, UA: NORMAL
pH, UA: 6.5 (ref 5.0–8.0)

## 2017-10-05 MED ORDER — AMOXICILLIN-POT CLAVULANATE 875-125 MG PO TABS: 1.0000 | ORAL_TABLET | Freq: Two times a day (BID) | ORAL | 0 refills | Status: DC

## 2017-10-05 MED ORDER — BENZONATATE 200 MG PO CAPS: 200.0000 mg | ORAL_CAPSULE | Freq: Three times a day (TID) | ORAL | 0 refills | Status: DC | PRN

## 2017-10-05 NOTE — Patient Instructions (Signed)
Amoxicillin; Clavulanic Acid tablets What is this medicine? AMOXICILLIN; CLAVULANIC ACID (a mox i SIL in; KLAV yoo lan ic AS id) is a penicillin antibiotic. It is used to treat certain kinds of bacterial infections. It will not work for colds, flu, or other viral infections. This medicine may be used for other purposes; ask your health care provider or pharmacist if you have questions. COMMON BRAND NAME(S): Augmentin What should I tell my health care provider before I take this medicine? They need to know if you have any of these conditions: -bowel disease, like colitis -kidney disease -liver disease -mononucleosis -an unusual or allergic reaction to amoxicillin, penicillin, cephalosporin, other antibiotics, clavulanic acid, other medicines, foods, dyes, or preservatives -pregnant or trying to get pregnant -breast-feeding How should I use this medicine? Take this medicine by mouth with a full glass of water. Follow the directions on the prescription label. Take at the start of a meal. Do not crush or chew. If the tablet has a score line, you may cut it in half at the score line for easier swallowing. Take your medicine at regular intervals. Do not take your medicine more often than directed. Take all of your medicine as directed even if you think you are better. Do not skip doses or stop your medicine early. Talk to your pediatrician regarding the use of this medicine in children. Special care may be needed. Overdosage: If you think you have taken too much of this medicine contact a poison control center or emergency room at once. NOTE: This medicine is only for you. Do not share this medicine with others. What if I miss a dose? If you miss a dose, take it as soon as you can. If it is almost time for your next dose, take only that dose. Do not take double or extra doses. What may interact with this medicine? -allopurinol -anticoagulants -birth control pills -methotrexate -probenecid This  list may not describe all possible interactions. Give your health care provider a list of all the medicines, herbs, non-prescription drugs, or dietary supplements you use. Also tell them if you smoke, drink alcohol, or use illegal drugs. Some items may interact with your medicine. What should I watch for while using this medicine? Tell your doctor or health care professional if your symptoms do not improve. Do not treat diarrhea with over the counter products. Contact your doctor if you have diarrhea that lasts more than 2 days or if it is severe and watery. If you have diabetes, you may get a false-positive result for sugar in your urine. Check with your doctor or health care professional. Birth control pills may not work properly while you are taking this medicine. Talk to your doctor about using an extra method of birth control. What side effects may I notice from receiving this medicine? Side effects that you should report to your doctor or health care professional as soon as possible: -allergic reactions like skin rash, itching or hives, swelling of the face, lips, or tongue -breathing problems -dark urine -fever or chills, sore throat -redness, blistering, peeling or loosening of the skin, including inside the mouth -seizures -trouble passing urine or change in the amount of urine -unusual bleeding, bruising -unusually weak or tired -white patches or sores in the mouth or throat Side effects that usually do not require medical attention (report to your doctor or health care professional if they continue or are bothersome): -diarrhea -dizziness -headache -nausea, vomiting -stomach upset -vaginal or anal irritation This list may   not describe all possible side effects. Call your doctor for medical advice about side effects. You may report side effects to FDA at 1-800-FDA-1088. Where should I keep my medicine? Keep out of the reach of children. Store at room temperature below 25 degrees  C (77 degrees F). Keep container tightly closed. Throw away any unused medicine after the expiration date. NOTE: This sheet is a summary. It may not cover all possible information. If you have questions about this medicine, talk to your doctor, pharmacist, or health care provider.  2018 Elsevier/Gold Standard (2007-12-20 12:04:30)  

## 2017-10-05 NOTE — Progress Notes (Signed)
Patient: Julie Boyer Female    DOB: 1961-07-08   56 y.o.   MRN: [id number] Visit Date: 10/05/2017  Today's Provider: Mar Daring, PA-C   Chief Complaint  Patient presents with  . URI  . Urinary Tract Infection   Subjective:    URI   This is a new problem. The current episode started 1 to 4 weeks ago (2 weeks ). The problem has been unchanged. There has been no fever. Associated symptoms include congestion, coughing, dysuria, ear pain (left; full), headaches, a plugged ear sensation, sinus pain and a sore throat. Pertinent negatives include no abdominal pain, chest pain, rhinorrhea, sneezing or wheezing. She has tried antihistamine and decongestant for the symptoms. The treatment provided mild relief.  Urinary Tract Infection   This is a new problem. The current episode started in the past 7 days. The problem has been gradually worsening. The quality of the pain is described as burning. The pain is mild. There has been no fever. Associated symptoms include frequency. Pertinent negatives include no flank pain or hematuria. She has tried nothing for the symptoms.      Allergies  Allergen Reactions  . Azithromycin Other (See Comments)    Z-Pak: Red splotches     Current Outpatient Medications:  .  BIOTIN FORTE PO, Take by mouth daily., Disp: , Rfl:  .  buPROPion (WELLBUTRIN SR) 150 MG 12 hr tablet, Take 1 tablet (150 mg total) by mouth 2 (two) times daily., Disp: 180 tablet, Rfl: 3 .  cetirizine (ZYRTEC) 10 MG tablet, Take 1 tablet by mouth daily., Disp: , Rfl:  .  Cyanocobalamin (VITAMIN B-12 PO), Take 1 tablet by mouth every 3 (three) days. , Disp: , Rfl:  .  estradiol (ESTRACE) 0.5 MG tablet, Take 1 tablet (0.5 mg total) daily by mouth., Disp: 30 tablet, Rfl: 11 .  Misc Natural Products (OSTEO BI-FLEX ADV DOUBLE ST PO), Take by mouth daily., Disp: , Rfl:  .  SYNTHROID 100 MCG tablet, Take 1 tablet by mouth daily., Disp: , Rfl: 11 .  triamcinolone (NASACORT  ALLERGY 24HR) 55 MCG/ACT AERO nasal inhaler, Place 2 sprays into the nose daily., Disp: , Rfl:   Review of Systems  Constitutional: Negative for fatigue and fever.  HENT: Positive for congestion, ear pain (left; full), postnasal drip, sinus pain and sore throat. Negative for rhinorrhea, sinus pressure and sneezing.   Eyes: Negative for visual disturbance.  Respiratory: Positive for cough. Negative for chest tightness, shortness of breath and wheezing.   Cardiovascular: Negative for chest pain, palpitations and leg swelling.  Gastrointestinal: Negative for abdominal pain.  Genitourinary: Positive for dysuria and frequency. Negative for flank pain and hematuria.  Neurological: Positive for headaches. Negative for dizziness and light-headedness.    Social History   Tobacco Use  . Smoking status: Current Every Day Smoker    Packs/day: 1.00    Years: 20.00    Pack years: 20.00  . Smokeless tobacco: Never Used  Substance Use Topics  . Alcohol use: Yes    Alcohol/week: 3.6 oz    Types: 6 Shots of liquor per week    Comment: OCCASIONALLY   Objective:   BP 108/72 (BP Location: Right Arm, Patient Position: Sitting, Cuff Size: Normal)   Pulse 64   Temp 97.8 F (36.6 C)   Resp 16   Ht 5\' 3"  (1.6 m)   Wt 138 lb (62.6 kg)   BMI 24.45 kg/m  Vitals:   10/05/17  1002  BP: 108/72  Pulse: 64  Resp: 16  Temp: 97.8 F (36.6 C)  Weight: 138 lb (62.6 kg)  Height: 5\' 3"  (1.6 m)     Physical Exam  Constitutional: She appears well-developed and well-nourished. No distress.  HENT:  Head: Normocephalic and atraumatic.  Right Ear: Hearing, tympanic membrane, external ear and ear canal normal.  Left Ear: Hearing, tympanic membrane, external ear and ear canal normal.  Nose: Right sinus exhibits maxillary sinus tenderness and frontal sinus tenderness. Left sinus exhibits maxillary sinus tenderness and frontal sinus tenderness.  Mouth/Throat: Uvula is midline, oropharynx is clear and moist and  mucous membranes are normal. No oropharyngeal exudate.  Neck: Normal range of motion. Neck supple. No tracheal deviation present. No thyromegaly present.  Cardiovascular: Normal rate, regular rhythm and normal heart sounds. Exam reveals no gallop and no friction rub.  No murmur heard. Pulmonary/Chest: Effort normal and breath sounds normal. No stridor. No respiratory distress. She has no wheezes. She has no rales.  Abdominal: Soft. Bowel sounds are normal. She exhibits no distension. There is no tenderness. There is no rebound, no guarding and no CVA tenderness.  Suprapubic pressure making her feel like she has to urinate with palpation  Lymphadenopathy:    She has no cervical adenopathy.  Skin: She is not diaphoretic.  Vitals reviewed.      Assessment & Plan:     1. Acute pansinusitis, recurrence not specified Worsening symptoms that have not responded to OTC medications. Will give augmentin as below. Continue allergy medications. Stay well hydrated and get plenty of rest. Call if no symptom improvement or if symptoms worsen. - amoxicillin-clavulanate (AUGMENTIN) 875-125 MG tablet; Take 1 tablet by mouth 2 (two) times daily.  Dispense: 20 tablet; Refill: 0  2. Acute cystitis without hematuria UA positive for leuks. Will treat with augmentin as below. Culture sent to check for sensitivities. Will adjust treatment as needed per C&S results. Push fluids.  - amoxicillin-clavulanate (AUGMENTIN) 875-125 MG tablet; Take 1 tablet by mouth 2 (two) times daily.  Dispense: 20 tablet; Refill: 0  3. Urinary frequency See above medical treatment plan. - POCT urinalysis dipstick - Urine Culture  4. Cough - benzonatate (TESSALON) 200 MG capsule; Take 1 capsule (200 mg total) by mouth 3 (three) times daily as needed for cough.  Dispense: 30 capsule; Refill: 0       Mar Daring, PA-C  [facility]

## 2017-10-08 LAB — URINE CULTURE
MICRO NUMBER:: 81453207
SPECIMEN QUALITY:: ADEQUATE

## 2017-10-09 ENCOUNTER — Telehealth: Payer: Self-pay

## 2017-10-09 NOTE — Telephone Encounter (Signed)
-----   Message from Mar Daring, PA-C sent at 10/09/2017  8:26 AM EST ----- Urine culture was positive for e.coli. It is susceptible to antibiotic you are on so continue until completed. Call if symptoms do not completely resolve or if they return.

## 2017-10-09 NOTE — Telephone Encounter (Signed)
Tried calling; Pt's voice mail box is full.   Thanks,   -Mickel Baas

## 2017-10-11 NOTE — Telephone Encounter (Signed)
Not able to [facility] mail box is full.

## 2017-10-12 NOTE — Telephone Encounter (Signed)
Closing Chart. Have tried to contact patient 3 times.No success.  Thanks,  -Joseline

## 2017-10-12 NOTE — Telephone Encounter (Signed)
Mail box continues to be full, unable to leave message.

## 2017-10-26 ENCOUNTER — Encounter: Payer: Self-pay | Admitting: Physician Assistant

## 2017-10-26 ENCOUNTER — Ambulatory Visit: Payer: BLUE CROSS/BLUE SHIELD | Admitting: Physician Assistant

## 2017-10-26 VITALS — BP 92/68 | HR 68 | Temp 98.1°F | Resp 16 | Ht 63.0 in | Wt 142.4 lb

## 2017-10-26 DIAGNOSIS — R05 Cough: Secondary | ICD-10-CM | POA: Diagnosis not present

## 2017-10-26 DIAGNOSIS — J0100 Acute maxillary sinusitis, unspecified: Secondary | ICD-10-CM | POA: Diagnosis not present

## 2017-10-26 DIAGNOSIS — R059 Cough, unspecified: Secondary | ICD-10-CM

## 2017-10-26 MED ORDER — BENZONATATE 200 MG PO CAPS: 200.0000 mg | ORAL_CAPSULE | Freq: Two times a day (BID) | ORAL | 0 refills | Status: DC | PRN

## 2017-10-26 MED ORDER — DOXYCYCLINE HYCLATE 100 MG PO TABS: 100.0000 mg | ORAL_TABLET | Freq: Two times a day (BID) | ORAL | 0 refills | Status: DC

## 2017-10-26 NOTE — Progress Notes (Signed)
Patient: Julie Boyer Female    DOB: May 22, 1961   57 y.o.   MRN: [id number] Visit Date: 10/26/2017  Today's Provider: Mar Daring, PA-C   Chief Complaint  Patient presents with  . URI   Subjective:    HPI Upper Respiratory Infection: Patient complains of symptoms of a URI, possible sinusitis. Symptoms include congestion and cough. Onset of symptoms was 4 weeks ago, gradually worsening since that time. She also c/o productive cough with  green colored sputum for the past 1 week .  She is drinking plenty of fluids. Evaluation to date: seen previously and thought to have a URI. Treatment to date: antibiotics and cough suppressants. Patient reports symptoms have been present for about a month. Patient has been treated with ABX, reports that symptoms did not completley resolve.    Follow up for hypothyroidism  The patient was last seen for this 6 months ago. Changes made at last visit include check labs.  Labs abnormal patient was advised to that TSH was borderline low which may mean the synthroid dose is too high. Patient advised to have TSH rechecked in 3 months.  She reports that she has seen specialist Dr. Alfredo Batty in the last 3 months.  ------------------------------------------------------------------------------------    Allergies  Allergen Reactions  . Azithromycin Other (See Comments)    Z-Pak: Red splotches     Current Outpatient Medications:  .  BIOTIN FORTE PO, Take by mouth daily., Disp: , Rfl:  .  buPROPion (WELLBUTRIN SR) 150 MG 12 hr tablet, Take 1 tablet (150 mg total) by mouth 2 (two) times daily., Disp: 180 tablet, Rfl: 3 .  cetirizine (ZYRTEC) 10 MG tablet, Take 1 tablet by mouth daily., Disp: , Rfl:  .  Cyanocobalamin (VITAMIN B-12 PO), Take 1 tablet by mouth every 3 (three) days. , Disp: , Rfl:  .  estradiol (ESTRACE) 0.5 MG tablet, Take 1 tablet (0.5 mg total) daily by mouth., Disp: 30 tablet, Rfl: 11 .  Misc Natural Products (OSTEO BI-FLEX  ADV DOUBLE ST PO), Take by mouth daily., Disp: , Rfl:  .  SYNTHROID 100 MCG tablet, Take 1 tablet by mouth daily., Disp: , Rfl: 11  Review of Systems  Constitutional: Negative.   HENT: Positive for congestion, rhinorrhea, sinus pressure, sinus pain and sore throat.   Respiratory: Positive for cough, shortness of breath and wheezing.   Cardiovascular: Negative.   Gastrointestinal: Negative.   Neurological: Negative for dizziness and headaches.    Social History   Tobacco Use  . Smoking status: Current Every Day Smoker    Packs/day: 1.00    Years: 20.00    Pack years: 20.00  . Smokeless tobacco: Never Used  Substance Use Topics  . Alcohol use: Yes    Alcohol/week: 3.6 oz    Types: 6 Shots of liquor per week    Comment: OCCASIONALLY   Objective:   BP 92/68 (BP Location: Left Arm, Patient Position: Sitting, Cuff Size: Large)   Pulse 68   Temp 98.1 F (36.7 C) (Oral)   Resp 16   Ht 5\' 3"  (1.6 m)   Wt 142 lb 6.4 oz (64.6 kg)   SpO2 99%   BMI 25.23 kg/m  Vitals:   10/26/17 0911  BP: 92/68  Pulse: 68  Resp: 16  Temp: 98.1 F (36.7 C)  TempSrc: Oral  SpO2: 99%  Weight: 142 lb 6.4 oz (64.6 kg)  Height: 5\' 3"  (1.6 m)     Physical Exam  Constitutional: She appears well-developed and well-nourished. No distress.  HENT:  Head: Normocephalic and atraumatic.  Right Ear: Hearing, tympanic membrane, external ear and ear canal normal.  Left Ear: Hearing, tympanic membrane, external ear and ear canal normal.  Nose: Right sinus exhibits maxillary sinus tenderness and frontal sinus tenderness. Left sinus exhibits no maxillary sinus tenderness and no frontal sinus tenderness.  Mouth/Throat: Uvula is midline, oropharynx is clear and moist and mucous membranes are normal. No oropharyngeal exudate, posterior oropharyngeal edema or posterior oropharyngeal erythema.  Neck: Normal range of motion. Neck supple. No tracheal deviation present. No thyromegaly present.  Cardiovascular:  Normal rate, regular rhythm and normal heart sounds. Exam reveals no gallop and no friction rub.  No murmur heard. Pulmonary/Chest: Effort normal and breath sounds normal. No stridor. No respiratory distress. She has no wheezes. She has no rales.  Lymphadenopathy:    She has no cervical adenopathy.  Skin: She is not diaphoretic.  Vitals reviewed.       Assessment & Plan:      1. Acute maxillary sinusitis, recurrence not specified Worsening symptoms that have not responded to OTC medications. Will give Doxycycline as below. Continue allergy medications. Stay well hydrated and get plenty of rest. Call if no symptom improvement or if symptoms worsen. - doxycycline (VIBRA-TABS) 100 MG tablet; Take 1 tablet (100 mg total) by mouth 2 (two) times daily.  Dispense: 20 tablet; Refill: 0  2. Cough Tessalon perles for cough. - benzonatate (TESSALON) 200 MG capsule; Take 1 capsule (200 mg total) by mouth 2 (two) times daily as needed for cough.  Dispense: 20 capsule; Refill: 0       Mar Daring, PA-C  [facility]

## 2017-12-20 ENCOUNTER — Encounter: Payer: Self-pay | Admitting: Physician Assistant

## 2017-12-20 ENCOUNTER — Ambulatory Visit: Payer: BLUE CROSS/BLUE SHIELD | Admitting: Physician Assistant

## 2017-12-20 VITALS — BP 118/68 | HR 76 | Temp 98.0°F | Resp 16 | Ht 63.0 in | Wt 147.0 lb

## 2017-12-20 DIAGNOSIS — J301 Allergic rhinitis due to pollen: Secondary | ICD-10-CM | POA: Diagnosis not present

## 2017-12-20 DIAGNOSIS — J0100 Acute maxillary sinusitis, unspecified: Secondary | ICD-10-CM

## 2017-12-20 DIAGNOSIS — R05 Cough: Secondary | ICD-10-CM

## 2017-12-20 DIAGNOSIS — R059 Cough, unspecified: Secondary | ICD-10-CM

## 2017-12-20 MED ORDER — BENZONATATE 200 MG PO CAPS: 200.0000 mg | ORAL_CAPSULE | Freq: Two times a day (BID) | ORAL | 0 refills | Status: DC | PRN

## 2017-12-20 MED ORDER — AMOXICILLIN-POT CLAVULANATE 875-125 MG PO TABS: 1.0000 | ORAL_TABLET | Freq: Two times a day (BID) | ORAL | 0 refills | Status: DC

## 2017-12-20 NOTE — Patient Instructions (Signed)

## 2017-12-20 NOTE — Progress Notes (Signed)
Patient: Julie Boyer Female    DOB: May 03, 1961   57 y.o.   MRN: [id number] Visit Date: 12/20/2017  Today's Provider: Mar Daring, PA-C   Chief Complaint  Patient presents with  . URI   Subjective:    URI   This is a recurrent problem. The current episode started in the past 7 days. The problem has been gradually worsening. There has been no fever. Associated symptoms include congestion, coughing, headaches, rhinorrhea, sinus pain and sneezing. Pertinent negatives include no ear pain or sore throat. She has tried decongestant for the symptoms. The treatment provided mild relief.   Patient feels that she never got well since she was seen in the office on 10/26/17.     Allergies  Allergen Reactions  . Azithromycin Other (See Comments)    Z-Pak: Red splotches     Current Outpatient Medications:  .  BIOTIN FORTE PO, Take by mouth daily., Disp: , Rfl:  .  buPROPion (WELLBUTRIN SR) 150 MG 12 hr tablet, Take 1 tablet (150 mg total) by mouth 2 (two) times daily., Disp: 180 tablet, Rfl: 3 .  cetirizine (ZYRTEC) 10 MG tablet, Take 1 tablet by mouth daily., Disp: , Rfl:  .  Cyanocobalamin (VITAMIN B-12 PO), Take 1 tablet by mouth every 3 (three) days. , Disp: , Rfl:  .  estradiol (ESTRACE) 0.5 MG tablet, Take 1 tablet (0.5 mg total) daily by mouth., Disp: 30 tablet, Rfl: 11 .  Misc Natural Products (OSTEO BI-FLEX ADV DOUBLE ST PO), Take by mouth daily., Disp: , Rfl:  .  SYNTHROID 100 MCG tablet, Take 1 tablet by mouth daily., Disp: , Rfl: 11 .  benzonatate (TESSALON) 200 MG capsule, Take 1 capsule (200 mg total) by mouth 2 (two) times daily as needed for cough. (Patient not taking: Reported on 12/20/2017), Disp: 20 capsule, Rfl: 0 .  doxycycline (VIBRA-TABS) 100 MG tablet, Take 1 tablet (100 mg total) by mouth 2 (two) times daily. (Patient not taking: Reported on 12/20/2017), Disp: 20 tablet, Rfl: 0  Review of Systems  Constitutional: Positive for fatigue. Negative for  fever.  HENT: Positive for congestion, postnasal drip, rhinorrhea, sinus pain and sneezing. Negative for ear pain and sore throat.   Respiratory: Positive for cough. Negative for chest tightness and shortness of breath.   Cardiovascular: Negative.   Gastrointestinal: Negative.   Genitourinary: Negative.   Neurological: Positive for headaches. Negative for dizziness.    Social History   Tobacco Use  . Smoking status: Current Every Day Smoker    Packs/day: 1.00    Years: 20.00    Pack years: 20.00  . Smokeless tobacco: Never Used  Substance Use Topics  . Alcohol use: Yes    Alcohol/week: 3.6 oz    Types: 6 Shots of liquor per week    Comment: OCCASIONALLY   Objective:   BP 118/68   Pulse 76   Temp 98 F (36.7 C)   Resp 16   Ht 5\' 3"  (1.6 m)   Wt 147 lb (66.7 kg)   SpO2 99%   BMI 26.04 kg/m    Physical Exam  Constitutional: She appears well-developed and well-nourished. No distress.  HENT:  Head: Normocephalic and atraumatic.  Right Ear: Hearing, tympanic membrane, external ear and ear canal normal.  Left Ear: Hearing, tympanic membrane, external ear and ear canal normal.  Nose: Right sinus exhibits maxillary sinus tenderness. Right sinus exhibits no frontal sinus tenderness. Left sinus exhibits maxillary sinus tenderness.  Left sinus exhibits no frontal sinus tenderness.  Mouth/Throat: Uvula is midline, oropharynx is clear and moist and mucous membranes are normal. No oropharyngeal exudate.  Neck: Normal range of motion. Neck supple. No tracheal deviation present. No thyromegaly present.  Cardiovascular: Normal rate, regular rhythm and normal heart sounds. Exam reveals no gallop and no friction rub.  No murmur heard. Pulmonary/Chest: Effort normal and breath sounds normal. No stridor. No respiratory distress. She has no wheezes. She has no rales.  Lymphadenopathy:    She has no cervical adenopathy.  Skin: She is not diaphoretic.  Vitals reviewed.       Assessment  & Plan:     1. Acute maxillary sinusitis, recurrence not specified Worsening symptoms that have not responded to OTC medications. Will give augmentin as below. Continue allergy medications. Stay well hydrated and get plenty of rest. Call if no symptom improvement or if symptoms worsen. - amoxicillin-clavulanate (AUGMENTIN) 875-125 MG tablet; Take 1 tablet by mouth 2 (two) times daily.  Dispense: 20 tablet; Refill: 0  2. Cough - benzonatate (TESSALON) 200 MG capsule; Take 1 capsule (200 mg total) by mouth 2 (two) times daily as needed for cough.  Dispense: 20 capsule; Refill: 0  3. Seasonal allergic rhinitis due to pollen Stable. Diagnosis pulled for medication refill. Continue current medical treatment plan. - cetirizine (ZYRTEC) 10 MG tablet; Take 1 tablet (10 mg total) by mouth daily.  Dispense: 30 tablet; Refill: 0       Mar Daring, PA-C  [facility]

## 2018-01-15 ENCOUNTER — Ambulatory Visit: Payer: BLUE CROSS/BLUE SHIELD | Admitting: Podiatry

## 2018-03-09 ENCOUNTER — Ambulatory Visit: Payer: BLUE CROSS/BLUE SHIELD | Admitting: Physician Assistant

## 2018-03-09 ENCOUNTER — Encounter: Payer: Self-pay | Admitting: Physician Assistant

## 2018-03-09 VITALS — BP 98/70 | HR 67 | Temp 97.6°F | Resp 16 | Ht 63.0 in | Wt 142.6 lb

## 2018-03-09 DIAGNOSIS — Z833 Family history of diabetes mellitus: Secondary | ICD-10-CM

## 2018-03-09 DIAGNOSIS — R39198 Other difficulties with micturition: Secondary | ICD-10-CM | POA: Diagnosis not present

## 2018-03-09 DIAGNOSIS — R109 Unspecified abdominal pain: Secondary | ICD-10-CM | POA: Diagnosis not present

## 2018-03-09 DIAGNOSIS — R309 Painful micturition, unspecified: Secondary | ICD-10-CM | POA: Diagnosis not present

## 2018-03-09 LAB — POCT URINALYSIS DIPSTICK
Bilirubin, UA: NEGATIVE
Blood, UA: NEGATIVE
Glucose, UA: NEGATIVE
KETONES UA: NEGATIVE
Leukocytes, UA: NEGATIVE
Nitrite, UA: NEGATIVE
Protein, UA: NEGATIVE
SPEC GRAV UA: 1.01 (ref 1.010–1.025)
Urobilinogen, UA: 0.2 E.U./dL
pH, UA: 7 (ref 5.0–8.0)

## 2018-03-09 NOTE — Progress Notes (Signed)
Patient: Julie Boyer Female    DOB: [birth year]   57 y.o.   MRN: [id number] Visit Date: 03/09/2018  Today's Provider: Mar Daring, PA-C   Chief Complaint  Patient presents with  . Urinary Tract Infection   Subjective:    HPI Urinary Tract Infection: Patient complains of bilateral flank pain, hesitancy and incomplete bladder emptying She has had symptoms for 6 days. Patient also complains of swelling all over body . Patient denies fever and vaginal discharge. Patient does not have a history of recurrent UTI.  Patient does not have a history of pyelonephritis. Patient reports she has started taking OTC Cyst and reports medication has helped with symptoms.      Allergies  Allergen Reactions  . Azithromycin Other (See Comments)    Z-Pak: Red splotches     Current Outpatient Medications:  .  BIOTIN FORTE PO, Take by mouth daily., Disp: , Rfl:  .  buPROPion (WELLBUTRIN SR) 150 MG 12 hr tablet, Take 1 tablet (150 mg total) by mouth 2 (two) times daily., Disp: 180 tablet, Rfl: 3 .  cetirizine (ZYRTEC) 10 MG tablet, Take 1 tablet (10 mg total) by mouth daily., Disp: 30 tablet, Rfl: 0 .  Cyanocobalamin (VITAMIN B-12 PO), Take 1 tablet by mouth every 3 (three) days. , Disp: , Rfl:  .  estradiol (ESTRACE) 0.5 MG tablet, Take 1 tablet (0.5 mg total) daily by mouth., Disp: 30 tablet, Rfl: 11 .  Misc Natural Products (OSTEO BI-FLEX ADV DOUBLE ST PO), Take by mouth daily., Disp: , Rfl:  .  SYNTHROID 100 MCG tablet, Take 1 tablet by mouth daily., Disp: , Rfl: 11  Review of Systems  HENT: Negative.   Respiratory: Negative.   Cardiovascular: Positive for leg swelling.  Genitourinary: Positive for decreased urine volume, difficulty urinating and flank pain.    Social History   Tobacco Use  . Smoking status: Current Every Day Smoker    Packs/day: 1.00    Years: 20.00    Pack years: 20.00  . Smokeless tobacco: Never Used  Substance Use Topics  . Alcohol use: Yes   Alcohol/week: 3.6 oz    Types: 6 Shots of liquor per week    Comment: OCCASIONALLY   Objective:   BP 98/70 (BP Location: Left Arm, Patient Position: Sitting, Cuff Size: Normal)   Pulse 67   Temp 97.6 F (36.4 C) (Oral)   Resp 16   Ht 5\' 3"  (1.6 m)   Wt 142 lb 9.6 oz (64.7 kg)   SpO2 98%   BMI 25.26 kg/m  Vitals:   03/09/18 0904  BP: 98/70  Pulse: 67  Resp: 16  Temp: 97.6 F (36.4 C)  TempSrc: Oral  SpO2: 98%  Weight: 142 lb 9.6 oz (64.7 kg)  Height: 5\' 3"  (1.6 m)     Physical Exam  Constitutional: She is oriented to person, place, and time. She appears well-developed and well-nourished. No distress.  Cardiovascular: Normal rate, regular rhythm and normal heart sounds. Exam reveals no gallop and no friction rub.  No murmur heard. Pulmonary/Chest: Effort normal and breath sounds normal. No respiratory distress. She has no wheezes. She has no rales.  Abdominal: Soft. Normal appearance and bowel sounds are normal. She exhibits no distension and no mass. There is no hepatosplenomegaly. There is no tenderness. There is no rebound, no guarding and no CVA tenderness.  Neurological: She is alert and oriented to person, place, and time.  Skin: Skin is warm  and dry. She is not diaphoretic.       Assessment & Plan:     1. Painful urination UA normal. Symptoms now resolved. - POCT urinalysis dipstick  2. Difficulty urinating Will check labs as below to r/o renal involvement and check for leukocytosis.  - CBC w/Diff/Platelet - Basic Metabolic Panel (BMET)  3. Family history of diabetes mellitus Will check labs as below and f/u pending results. - HgB A1c  4. Flank pain Now improved. Will check labs as below and f/u pending results. - CBC w/Diff/Platelet - Basic Metabolic Panel (BMET)       Mar Daring, PA-C  [facility]

## 2018-03-10 LAB — CBC WITH DIFFERENTIAL/PLATELET
BASOS ABS: 0 10*3/uL (ref 0.0–0.2)
Basos: 0 %
EOS (ABSOLUTE): 0.1 10*3/uL (ref 0.0–0.4)
Eos: 1 %
Hematocrit: 37.1 % (ref 34.0–46.6)
Hemoglobin: 12.9 g/dL (ref 11.1–15.9)
Immature Grans (Abs): 0 10*3/uL (ref 0.0–0.1)
Immature Granulocytes: 0 %
LYMPHS: 14 %
Lymphocytes Absolute: 1.3 10*3/uL (ref 0.7–3.1)
MCH: 33 pg (ref 26.6–33.0)
MCHC: 34.8 g/dL (ref 31.5–35.7)
MCV: 95 fL (ref 79–97)
MONOS ABS: 0.6 10*3/uL (ref 0.1–0.9)
Monocytes: 6 %
NEUTROS ABS: 7.4 10*3/uL — AB (ref 1.4–7.0)
Neutrophils: 79 %
PLATELETS: 223 10*3/uL (ref 150–450)
RBC: 3.91 x10E6/uL (ref 3.77–5.28)
RDW: 13.1 % (ref 12.3–15.4)
WBC: 9.4 10*3/uL (ref 3.4–10.8)

## 2018-03-10 LAB — BASIC METABOLIC PANEL
BUN / CREAT RATIO: 13 (ref 9–23)
BUN: 9 mg/dL (ref 6–24)
CHLORIDE: 100 mmol/L (ref 96–106)
CO2: 23 mmol/L (ref 20–29)
Calcium: 9.5 mg/dL (ref 8.7–10.2)
Creatinine, Ser: 0.71 mg/dL (ref 0.57–1.00)
GFR calc non Af Amer: 94 mL/min/{1.73_m2} (ref >59)
GFR, EST AFRICAN AMERICAN: 109 mL/min/{1.73_m2} (ref >59)
GLUCOSE: 76 mg/dL (ref 65–99)
POTASSIUM: 3.8 mmol/L (ref 3.5–5.2)
SODIUM: 138 mmol/L (ref 134–144)

## 2018-03-10 LAB — HEMOGLOBIN A1C
Est. average glucose Bld gHb Est-mCnc: 105 mg/dL
HEMOGLOBIN A1C: 5.3 % (ref 4.8–5.6)

## 2018-03-12 ENCOUNTER — Telehealth: Payer: Self-pay

## 2018-03-12 NOTE — Telephone Encounter (Signed)
LMTCB

## 2018-03-12 NOTE — Telephone Encounter (Signed)
-----   Message from Mar Daring, PA-C sent at 03/12/2018  8:20 AM EDT ----- All labs are within normal limits and stable.  Thanks! -JB

## 2018-03-13 NOTE — Telephone Encounter (Signed)
Advised patient of results.  

## 2018-06-04 ENCOUNTER — Ambulatory Visit
Admission: EM | Admit: 2018-06-04 | Discharge: 2018-06-04 | Disposition: A | Discharge status: Home / Self Care | Payer: BLUE CROSS/BLUE SHIELD | Attending: Family Medicine | Admitting: Family Medicine

## 2018-06-04 ENCOUNTER — Ambulatory Visit (INDEPENDENT_AMBULATORY_CARE_PROVIDER_SITE_OTHER): Payer: BLUE CROSS/BLUE SHIELD

## 2018-06-04 ENCOUNTER — Ambulatory Visit (INDEPENDENT_AMBULATORY_CARE_PROVIDER_SITE_OTHER)
Admit: 2018-06-04 | Discharge: 2018-06-04 | Disposition: A | Discharge status: Home / Self Care | Payer: BLUE CROSS/BLUE SHIELD | Attending: *Deleted | Admitting: *Deleted

## 2018-06-04 ENCOUNTER — Encounter: Payer: Self-pay | Admitting: Emergency Medicine

## 2018-06-04 ENCOUNTER — Other Ambulatory Visit: Payer: Self-pay

## 2018-06-04 DIAGNOSIS — S8012XA Contusion of left lower leg, initial encounter: Secondary | ICD-10-CM

## 2018-06-04 DIAGNOSIS — M25561 Pain in right knee: Secondary | ICD-10-CM | POA: Diagnosis not present

## 2018-06-04 DIAGNOSIS — M79605 Pain in left leg: Secondary | ICD-10-CM

## 2018-06-04 DIAGNOSIS — R2242 Localized swelling, mass and lump, left lower limb: Secondary | ICD-10-CM | POA: Diagnosis not present

## 2018-06-04 DIAGNOSIS — M7989 Other specified soft tissue disorders: Secondary | ICD-10-CM | POA: Diagnosis not present

## 2018-06-04 DIAGNOSIS — M1711 Unilateral primary osteoarthritis, right knee: Secondary | ICD-10-CM | POA: Diagnosis not present

## 2018-06-04 MED ORDER — PREDNISONE 10 MG PO TABS: ORAL_TABLET | ORAL | 0 refills | Status: DC

## 2018-06-04 NOTE — ED Provider Notes (Signed)
[facility] ____________________________________________  Time seen: Approximately 11:38 AM  I have reviewed the triage vital signs and the nursing notes.   HISTORY  Chief Complaint Fall; Leg Pain (left); and Knee Pain (right)   HPI Julie Boyer is a 57 y.o. female presenting for evaluation of bilateral extremity complaints after she had a fall on 04/29/2018.  States that she was standing on a stool to water her flowers, the stool slipped in the stool hit her shins.  States unsure if she twisted her right knee or hit her right knee in the fall as she has had pain there as well.  Denies head injury loss of consciousness or other injuries.  States that she has had continued right knee pain for the last month, worse as the day progresses and worse as she stays on her knee with associated intermittent swelling and clicking and popping.  Denies any giving way.  States also with intermittent left lower leg swelling along her ankle and some ankle pain intermittently.  States that she did have a cut to her left shin where the stool hit her leg but states she has been putting topical antibiotic ointment in that area has been healing but still with some swelling.  Has continued to remain ambulatory.  States knee pain began day after injury.  States left lower leg discomfort is very minimal and only after being on her feet all day.  States right knee pain is her biggest complaint.  States that she has been taken 800 mg of ibuprofen several times a day intermittently with some improvement but no resolution.  Denies other aggravating or alleviating factors.  Previous meniscal repair surgery to right knee x2.  Denies any other leg swelling.  Reports otherwise feels well.  Denies recent sickness. Denies recent antibiotic use.  Last tetanus immunization 2016.  Mar Daring, PA-C: PCP   Past Medical History:  Diagnosis Date  . Arthritis    hands  . Complication of anesthesia    sneezing after last colonoscopy  . Endometriosis    uterus  . Family history of breast cancer    pt is BRCA neg, 11/18 updated genetic testing letter sent; IBIS=11.8%  . Genetic testing of female    BRCA neg  . History of mammogram 08/31/2011; 03/18/15   neg; benign  . History of Papanicolaou smear of cervix 06/22/11; 03/18/15   NEG; NEG  . Numbness and tingling in hands    pt thinks may be Carpal Tunnel issues  . Osteopenia 07/2015   spine - DR. MORAYATI  . Thyroid disorder   . Wears dentures    partial lower    Patient Active Problem List   Diagnosis Date Noted  . Hx of colonic polyps   . Benign neoplasm of cecum   . Depression 05/01/2015  . Hypothyroidism 04/02/2015  . Sinus trouble 04/02/2015  . Fatigue 04/02/2015    Past Surgical History:  Procedure Laterality Date  . ABDOMINAL HYSTERECTOMY  11/171992   tab, bso - BVD  . COLONOSCOPY  05/21/2010   benign appearing polyp was found in the rectum  . COLONOSCOPY WITH PROPOFOL N/A 05/21/2015   Procedure: COLONOSCOPY WITH PROPOFOL;  Surgeon: [name], MD;  Location: [facility];  Service: Endoscopy;  Laterality: N/A;  . KNEE SURGERY Right 03/2001; 03/2013; 2011   arthroscopic  . POLYPECTOMY  05/21/2015   Procedure: POLYPECTOMY;  Surgeon: [name], MD;  Location: [facility];  Service: Endoscopy;;  . TUBAL LIGATION  10/01/1989     No current facility-administered medications for this encounter.   Current Outpatient Medications:  .  BIOTIN FORTE PO, Take by mouth daily., Disp: , Rfl:  .  buPROPion (WELLBUTRIN SR) 150 MG 12 hr tablet, Take 1 tablet (150 mg total) by mouth 2 (two) times daily., Disp: 180 tablet, Rfl: 3 .  cetirizine (ZYRTEC) 10 MG tablet, Take 1 tablet (10 mg total) by mouth daily., Disp: 30 tablet, Rfl: 0 .  Cyanocobalamin (VITAMIN B-12 PO), Take 1 tablet by mouth every 3 (three) days. , Disp: , Rfl:  .  estradiol (ESTRACE) 0.5 MG tablet, Take 1 tablet (0.5 mg total) daily by mouth., Disp:  30 tablet, Rfl: 11 .  SYNTHROID 100 MCG tablet, Take 1 tablet by mouth daily., Disp: , Rfl: 11 .  Misc Natural Products (OSTEO BI-FLEX ADV DOUBLE ST PO), Take by mouth daily., Disp: , Rfl:  .  predniSONE (DELTASONE) 10 MG tablet, Start 60 mg po day one, then 50 mg po day two, taper by 10 mg daily until complete., Disp: 21 tablet, Rfl: 0  Allergies Azithromycin  Family History  Problem Relation Age of Onset  . Prostate cancer Father 47  . Bone cancer Father   . Cancer Father 89       KIDNEY  . Hypertension Father   . Breast cancer Sister 23       has had breast cancer twice  . Diabetes Mother        TYPE 2  . Alzheimer's disease Mother   . Cancer Brother 33       KIDNEY  . Lung cancer Brother 67  . Breast cancer Maternal Aunt 80    Social History Social History   Tobacco Use  . Smoking status: Current Every Day Smoker    Packs/day: 1.00    Years: 20.00    Pack years: 20.00  . Smokeless tobacco: Never Used  Substance Use Topics  . Alcohol use: Yes    Alcohol/week: 6.0 standard drinks    Types: 6 Shots of liquor per week    Comment: OCCASIONALLY  . Drug use: No    Review of Systems Constitutional: No fever/chills Cardiovascular: Denies chest pain. Respiratory: Denies shortness of breath. Gastrointestinal: No abdominal pain.   Musculoskeletal: Negative for back pain. As above . Skin: Negative for rash. Neurological: Negative for headaches, focal weakness or numbness.   ____________________________________________   PHYSICAL EXAM:  VITAL SIGNS: ED Triage Vitals  Enc Vitals Group     BP 06/04/18 1101 109/83     Pulse Rate 06/04/18 1101 69     Resp 06/04/18 1101 16     Temp 06/04/18 1101 97.9 F (36.6 C)     Temp Source 06/04/18 1101 Oral     SpO2 06/04/18 1101 100 %     Weight 06/04/18 1059 138 lb (62.6 kg)     Height 06/04/18 1059 _0  (1.6 m)     Head Circumference --      Peak Flow --      Pain Score 06/04/18 1059 5     Pain Loc --      Pain  Edu? --      Excl. in [place]? --     Constitutional: Alert and oriented. Well appearing and in no acute distress. ENT      Head: Normocephalic and atraumatic. Cardiovascular: Normal rate, regular rhythm. Grossly normal heart sounds.  Good peripheral circulation. Respiratory: Normal respiratory effort without tachypnea nor retractions. Breath  sounds are clear and equal bilaterally. No wheezes, rales, rhonchi. Musculoskeletal:  No midline cervical, thoracic or lumbar tenderness to palpation. Bilateral pedal pulses equal and easily palpated. Except: Right medial and lateral knee inferior to patella mild swelling and tenderness, mild right lateral knee pain with medial and lateral stress, no pain with anterior posterior drawer test, able to fully extend and flex knee, no posterior tenderness, knee appears stable, ambulatory with steady gait.  Right lower extremity otherwise nontender and no other edema noted to right lower extremity. Except: Left medial lower pretibial area with soft fluctuance approximately 6 x 4 cm with superficial healing abrasion on top, no erythema, no drainage, mild tenderness to mild tibial tenderness, mild left lateral malleolus tenderness, ankle with full range of motion present, no pain with plantarflexion or dorsiflexion, left foot nontender, normal distal sensation and capillary refill, left lower chimney otherwise nontender no edema noted. Neurologic:  Normal speech and language. No gross focal neurologic deficits are appreciated. Speech is normal. No gait instability.  Skin:  Skin is warm, dry and intact. No rash noted. Psychiatric: Mood and affect are normal. Speech and behavior are normal. Patient exhibits appropriate insight and judgment   ___________________________________________   LABS (all labs ordered are listed, but only abnormal results are displayed)  Labs Reviewed - No data to display ____________________________________________  RADIOLOGY  Dg  Tibia/fibula Left  Result Date: 06/04/2018 CLINICAL DATA:  Fall on April 29, 2018. Lower leg pain. Initial encounter. EXAM: LEFT TIBIA AND FIBULA - 2 VIEW COMPARISON:  None. FINDINGS: Soft tissue stranding along the medial lower shin where there is a symptom marker. No opaque foreign body or fracture. IMPRESSION: Soft tissue swelling without opaque foreign body or fracture. Electronically Signed   By: Monte Fantasia M.D.   On: 06/04/2018 12:34   Dg Ankle Complete Left  Result Date: 06/04/2018 CLINICAL DATA:  Fall, left lower leg pain, initial encounter. EXAM: LEFT ANKLE COMPLETE - 3+ VIEW COMPARISON:  None. FINDINGS: No acute osseous or joint abnormality. There may be focal soft tissue edema along the distal tibial shaft. IMPRESSION: No acute osseous or joint abnormality. Electronically Signed   By: Lorin Picket M.D.   On: 06/04/2018 12:34   US Venous Img Lower Unilateral Left  Result Date: 06/04/2018 CLINICAL DATA:  57 year old female with left lower extremity swelling and pain for the past month EXAM: LEFT LOWER EXTREMITY VENOUS DOPPLER ULTRASOUND TECHNIQUE: Gray-scale sonography with graded compression, as well as color Doppler and duplex ultrasound were performed to evaluate the lower extremity deep venous systems from the level of the common femoral vein and including the common femoral, femoral, profunda femoral, popliteal and calf veins including the posterior tibial, peroneal and gastrocnemius veins when visible. The superficial great saphenous vein was also interrogated. Spectral Doppler was utilized to evaluate flow at rest and with distal augmentation maneuvers in the common femoral, femoral and popliteal veins. COMPARISON:  None. FINDINGS: Contralateral Common Femoral Vein: Respiratory phasicity is normal and symmetric with the symptomatic side. No evidence of thrombus. Normal compressibility. Common Femoral Vein: No evidence of thrombus. Normal compressibility, respiratory phasicity and  response to augmentation. Saphenofemoral Junction: No evidence of thrombus. Normal compressibility and flow on color Doppler imaging. Profunda Femoral Vein: No evidence of thrombus. Normal compressibility and flow on color Doppler imaging. Femoral Vein: No evidence of thrombus. Normal compressibility, respiratory phasicity and response to augmentation. Popliteal Vein: No evidence of thrombus. Normal compressibility, respiratory phasicity and response to augmentation. Calf Veins: No evidence of  thrombus. Normal compressibility and flow on color Doppler imaging. Superficial Great Saphenous Vein: No evidence of thrombus. Normal compressibility. Venous Reflux:  None. Other Findings: In the region of clinical concern there is a heterogeneous complex cystic structure measuring approximately 6.4 x 1.4 cm in the superficial soft tissues overlying the medial aspect of the left calf. There is no evidence of internal vascularity on color Doppler evaluation. IMPRESSION: 1. No evidence of deep venous thrombosis. 2. Complex fluid collection in the superficial soft tissues of the medial aspect of the left calf. No evidence of internal vascularity to suggest that this represents a soft tissue mass. Differential considerations include hematoma, and in the appropriate clinical setting, abscess. Hematoma is favored. Electronically Signed   By: Jacqulynn Cadet M.D.   On: 06/04/2018 12:27   Dg Knee Complete 4 Views Right  Result Date: 06/04/2018 CLINICAL DATA:  Fall, right knee pain, initial encounter. EXAM: RIGHT KNEE - COMPLETE 4+ VIEW COMPARISON:  None. FINDINGS: Tricompartment osteophytosis. Mild subchondral sclerosis along the medial and lateral tibial plateaus as well as along the patellar joint surface. Difficult to exclude a tiny joint effusion. No fracture. IMPRESSION: 1. Difficult to exclude a tiny joint effusion.  No fracture. 2. Tricompartment osteoarthritis. Electronically Signed   By: Lorin Picket M.D.   On:  06/04/2018 12:36   ____________________________________________   PROCEDURES Procedures   INITIAL IMPRESSION / ASSESSMENT AND PLAN / ED COURSE  Pertinent labs & imaging results that were available during my care of the patient were reviewed by me and considered in my medical decision making (see chart for details).  Well-appearing patient.  No acute distress.  Mechanical injury 1 month ago with pain to right knee and above issues to left lower leg.  Suspect residual hematoma to left lower leg and concern for meniscal injury to right knee.  Will evaluate right knee x-ray left lower leg ultrasound to exclude DVT and left tib-fib and ankle.  Imaging results as above per radiologist and discussed with patient.  Suspect hematoma left lower leg, and discussed elevation, warm compresses and supportive care.  Right knee pain, suspect small effusion and also as above osteoarthritis, also suspicious of meniscal or ligamentous injury.  Recommend follow-up with orthopedic.  Will treat with prednisone taper.  Previously seen Dr. Rudene Christians, follow-up this week.Discussed indication, risks and benefits of medications with patient.  Discussed follow up and return parameters including no resolution or any worsening concerns. Patient verbalized understanding and agreed to plan.   ____________________________________________   FINAL CLINICAL IMPRESSION(S) / ED DIAGNOSES  Final diagnoses:  Acute pain of right knee  Leg hematoma, left, initial encounter  Left leg pain     ED Discharge Orders         Ordered    predniSONE (DELTASONE) 10 MG tablet     06/04/18 1255           Note: This dictation was prepared with Dragon dictation along with smaller phrase technology. Any transcriptional errors that result from this process are unintentional.         Marylene Land, NP 06/04/18 1319

## 2018-06-04 NOTE — ED Triage Notes (Signed)
Patient states that she was watering her plants and the stool slipped out from out of her and fell on July 21.  Patient c/o left lower leg pain and right knee pain.

## 2018-06-04 NOTE — Discharge Instructions (Signed)
Take medication as prescribed. Drink plenty of fluids.  Supportive care.  Warm compresses to hematoma site with elevation.  Follow-up with orthopedic this week as discussed.  Follow up with your primary care physician this week as needed. Return to Urgent care for new or worsening concerns.

## 2018-06-07 NOTE — Progress Notes (Signed)
 Review of Systems Constitutional: Negative.  HENT: Negative.   Eyes: Negative.  Respiratory: Negative.   Cardiovascular: Negative.  Gastrointestinal: Negative.  Endocrine: Negative.  Genitourinary: Negative.  Musculoskeletal:  Positive for arthralgias. Skin: Negative.  Allergic/Immunologic: Negative.  Neurological: Negative.  Hematological: Negative.  Psychiatric/Behavioral: Negative.

## 2018-06-07 NOTE — Patient Instructions (Signed)
 Patient Education    Cellulitis: Care Instructions Your Care Instructions   Cellulitis is a skin infection caused by bacteria, most often strep or staph. It often occurs after a break in the skin from a scrape, cut, bite, or puncture, or after a rash. Cellulitis may be treated without doing tests to find out what caused it. But your doctor may do tests, if needed, to look for a specific bacteria, like methicillin-resistant Staphylococcus aureus (MRSA). The doctor has checked you carefully, but problems can develop later. If you notice any problems or new symptoms, get medical treatment right away. Follow-up care is a key part of your treatment and safety. Be sure to make and go to all appointments, and call your doctor if you are having problems. It's also a good idea to know your test results and keep a list of the medicines you take. How can you care for yourself at home? Take your antibiotics as directed. Do not stop taking them just because you feel better. You need to take the full course of antibiotics. Prop up the infected area on pillows to reduce pain and swelling. Try to keep the area above the level of your heart as often as you can. If your doctor told you how to care for your wound, follow your doctor's instructions. If you did not get instructions, follow this general advice: Wash the wound with clean water  2 times a day. Don't use hydrogen peroxide or alcohol, which can slow healing. You may cover the wound with a thin layer of petroleum jelly, such as Vaseline, and a nonstick bandage. Apply more petroleum jelly and replace the bandage as needed. Be safe with medicines. Take pain medicines exactly as directed. If the doctor gave you a prescription medicine for pain, take it as prescribed. If you are not taking a prescription pain medicine, ask your doctor if you can take an over-the-counter medicine. To prevent cellulitis in the future Try to prevent cuts, scrapes, or other  injuries to your skin. Cellulitis most often occurs where there is a break in the skin. If you get a scrape, cut, mild burn, or bite, wash the wound with clean water  as soon as you can to help avoid infection. Don't use hydrogen peroxide or alcohol, which can slow healing. If you have swelling in your legs (edema), support stockings and good skin care may help prevent leg sores and cellulitis. Take care of your feet, especially if you have diabetes or other conditions that increase the risk of infection. Wear shoes and socks. Do not go barefoot. If you have athlete's foot or other skin problems on your feet, talk to your doctor about how to treat them. When should you call for help?  Call your doctor now or seek immediate medical care if:   You have signs that your infection is getting worse, such as:   Increased pain, swelling, warmth, or redness.   Red streaks leading from the area.   Pus draining from the area.   A fever.   You get a rash.   Watch closely for changes in your health, and be sure to contact your doctor if:   You do not get better as expected.  Where can you learn more? Log in to your [facility] MyChart account at [email or web address] and click on top menu option Health then select Search Medical Library. Enter [phone] in the search box and click the magnify glass to learn more about Cellulitis: Care Instructions. Current as of: January 08, 2018 Content Version: 12.1  2006-2019 [employer]. Care instructions adapted under license by your healthcare professional. If you have questions about a medical condition or this instruction, always ask your healthcare professional. [employer] disclaims any warranty or liability for your use of this information.

## 2018-09-05 ENCOUNTER — Other Ambulatory Visit: Payer: Self-pay | Admitting: Maternal Newborn

## 2018-09-05 DIAGNOSIS — Z1231 Encounter for screening mammogram for malignant neoplasm of breast: Secondary | ICD-10-CM

## 2018-09-20 ENCOUNTER — Ambulatory Visit (INDEPENDENT_AMBULATORY_CARE_PROVIDER_SITE_OTHER): Payer: BLUE CROSS/BLUE SHIELD | Admitting: Maternal Newborn

## 2018-09-20 ENCOUNTER — Encounter: Payer: Self-pay | Admitting: Maternal Newborn

## 2018-09-20 VITALS — BP 120/80 | Ht 63.0 in | Wt 144.0 lb

## 2018-09-20 DIAGNOSIS — Z23 Encounter for immunization: Secondary | ICD-10-CM | POA: Diagnosis not present

## 2018-09-20 DIAGNOSIS — N951 Menopausal and female climacteric states: Secondary | ICD-10-CM

## 2018-09-20 DIAGNOSIS — Z01419 Encounter for gynecological examination (general) (routine) without abnormal findings: Secondary | ICD-10-CM | POA: Diagnosis not present

## 2018-09-20 DIAGNOSIS — Z1211 Encounter for screening for malignant neoplasm of colon: Secondary | ICD-10-CM

## 2018-09-20 DIAGNOSIS — M810 Age-related osteoporosis without current pathological fracture: Secondary | ICD-10-CM

## 2018-09-20 MED ORDER — ESTRADIOL 0.5 MG PO TABS: 0.5000 mg | ORAL_TABLET | Freq: Every day | ORAL | 11 refills | Status: DC

## 2018-09-20 NOTE — Progress Notes (Signed)
Gynecology Annual Exam  PCP: Mar Daring, PA-C  Chief Complaint:  Chief Complaint  Patient presents with  . Gynecologic Exam    History of Present Illness:Patient is a 57 y.o. [id number] presenting for an annual exam. She was recently diagnosed with osteoporosis and would like to renew her Estrace prescription.  LMP: No LMP recorded. Patient has had a hysterectomy. Postmenopausal Bleeding: No  The patient is sexually active. The patient does not perform self breast exams.  There is notable family history of breast cancer and has previously had genetic screening at [facility] in 2014.  The patient wears seatbelts: yes.   The patient has regular exercise: yes.    The patient denies current symptoms of depression.     Review of Systems  Constitutional: Negative.   HENT: Positive for congestion.   Eyes: Negative.   Respiratory: Positive for cough. Negative for shortness of breath and wheezing.   Cardiovascular: Negative for chest pain and palpitations.  Gastrointestinal: Positive for constipation. Negative for abdominal pain.  Genitourinary: Negative.   Musculoskeletal: Positive for joint pain.  Skin: Positive for itching.  Neurological: Negative.   Endo/Heme/Allergies: Positive for environmental allergies. Bruises/bleeds easily.       Heat intolerance  Psychiatric/Behavioral: Negative.   All other systems reviewed and are negative.   Past Medical History:  Past Medical History:  Diagnosis Date  . Arthritis    hands  . Complication of anesthesia    sneezing after last colonoscopy  . Endometriosis    uterus  . Family history of breast cancer    pt is BRCA neg, 11/18 updated genetic testing letter sent; IBIS=11.8%  . Genetic testing of female    BRCA neg  . History of mammogram 08/31/2011; 03/18/15   neg; benign  . History of Papanicolaou smear of cervix 06/22/11; 03/18/15   NEG; NEG  . Numbness and tingling in hands    pt thinks may be Carpal Tunnel issues  .  Osteopenia 07/2015   spine - DR. MORAYATI  . Thyroid disorder   . Wears dentures    partial lower    Past Surgical History:  Past Surgical History:  Procedure Laterality Date  . ABDOMINAL HYSTERECTOMY  11/171992   tab, bso - BVD  . COLONOSCOPY  05/21/2010   benign appearing polyp was found in the rectum  . COLONOSCOPY WITH PROPOFOL N/A 05/21/2015   Procedure: COLONOSCOPY WITH PROPOFOL;  Surgeon: [name], MD;  Location: [place];  Service: Endoscopy;  Laterality: N/A;  . KNEE SURGERY Right 03/2001; 03/2013; 2011   arthroscopic  . POLYPECTOMY  05/21/2015   Procedure: POLYPECTOMY;  Surgeon: [name], MD;  Location: [place];  Service: Endoscopy;;  . TUBAL LIGATION  10/01/1989    Gynecologic History:  No LMP recorded. Patient has had a hysterectomy. Last Pap: 08/16/2017. Results were:  NIL and HPV negative  Last mammogram: 08/04/2016.  Results were: BI-RADS I Obstetric History: [id number]  Family History:  Family History  Problem Relation Age of Onset  . Prostate cancer Father 29  . Bone cancer Father   . Cancer Father 29       KIDNEY  . Hypertension Father   . Breast cancer Sister 47       has had breast cancer twice  . Diabetes Mother        TYPE 2  . Alzheimer's disease Mother   . Cancer Brother 17       KIDNEY  . Lung cancer Brother  55  . Breast cancer Maternal Aunt 80    Social History:  Social History   Socioeconomic History  . Marital status: Single    Spouse name: Not on file  . Number of children: 3  . Years of education: Not on file  . Highest education level: Not on file  Occupational History  . Occupation: BUSINESS  Social Needs  . Financial resource strain: Not on file  . Food insecurity:    Worry: Not on file    Inability: Not on file  . Transportation needs:    Medical: Not on file    Non-medical: Not on file  Tobacco Use  . Smoking status: Current Every Day Smoker    Packs/day: 1.00    Years: 20.00    Pack years:  20.00  . Smokeless tobacco: Never Used  Substance and Sexual Activity  . Alcohol use: Yes    Alcohol/week: 6.0 standard drinks    Types: 6 Shots of liquor per week    Comment: OCCASIONALLY  . Drug use: No  . Sexual activity: Yes    Birth control/protection: Surgical  Lifestyle  . Physical activity:    Days per week: Not on file    Minutes per session: Not on file  . Stress: Not on file  Relationships  . Social connections:    Talks on phone: Not on file    Gets together: Not on file    Attends religious service: Not on file    Active member of club or organization: Not on file    Attends meetings of clubs or organizations: Not on file    Relationship status: Not on file  . Intimate partner violence:    Fear of current or ex partner: Not on file    Emotionally abused: Not on file    Physically abused: Not on file    Forced sexual activity: Not on file  Other Topics Concern  . Not on file  Social History Narrative  . Not on file    Allergies:  Allergies  Allergen Reactions  . Azithromycin Other (See Comments)    Z-Pak: Red splotches    Medications: Prior to Admission medications   Medication Sig Start Date End Date Taking? Authorizing Provider  buPROPion (WELLBUTRIN SR) 150 MG 12 hr tablet Take 1 tablet (150 mg total) by mouth 2 (two) times daily. 09/21/17  Yes Mar Daring, PA-C  cetirizine (ZYRTEC) 10 MG tablet Take 1 tablet (10 mg total) by mouth daily. 12/20/17  Yes Mar Daring, PA-C  Cyanocobalamin (VITAMIN B-12 PO) Take 1 tablet by mouth every 3 (three) days.    Yes [provider]  SYNTHROID 100 MCG tablet Take 1 tablet by mouth daily. 03/17/15  Yes [provider]  BIOTIN FORTE PO Take by mouth daily.    [provider]  estradiol (ESTRACE) 0.5 MG tablet Take 1 tablet (0.5 mg total) daily by mouth. Patient not taking: Reported on 09/20/2018 08/16/17   Rexene Agent, CNM  Misc Natural Products (OSTEO BI-FLEX ADV  DOUBLE ST PO) Take by mouth daily.    [provider]  predniSONE (DELTASONE) 10 MG tablet Start 60 mg po day one, then 50 mg po day two, taper by 10 mg daily until complete. Patient not taking: Reported on 09/20/2018 06/04/18   Marylene Land, NP    Physical Exam Vitals: Blood pressure 120/80, height '5\' 3"'  (1.6 m), weight 144 lb (65.3 kg).  General: NAD HEENT: normocephalic, anicteric Thyroid: no  enlargement, no palpable nodules Pulmonary: No increased work of breathing, CTAB Cardiovascular: RRR, no murmurs, rubs, or gallops Breast: Breasts symmetrical, no tenderness, no palpable nodules or masses, no skin or nipple retraction present, no nipple discharge.  No axillary or supraclavicular lymphadenopathy. Abdomen: NABS, soft, non-tender, non-distended.  Umbilicus without lesions.  No hepatomegaly, splenomegaly or masses palpable. No evidence of hernia  Genitourinary: pelvic exam deferred after shared decision-making. Asymptomatic and no screening tests due. Extremities: no edema, erythema, or tenderness Neurologic: Grossly intact Psychiatric: mood appropriate, affect full   Assessment: 57 y.o. [id number] here for an annual exam.  Plan: Problem List Items Addressed This Visit    None    Visit Diagnoses    Women's annual routine gynecological examination    -  Primary   Hot flashes due to menopause       Relevant Medications   estradiol (ESTRACE) 0.5 MG tablet   Osteoporosis, unspecified osteoporosis type, unspecified pathological fracture presence       Relevant Medications   estradiol (ESTRACE) 0.5 MG tablet   Colon cancer screening       Relevant Orders   Ambulatory referral to Gastroenterology   Need for immunization against influenza       Relevant Orders   Flu Vaccine QUAD 36+ mos IM (Completed)      1) Mammogram - recommend yearly screening mammogram.  Mammogram has been ordered previously and scheduled for 09/27/2018.  2) STI screening was offered and  declined.  3) ASCCP guidelines and rationale discussed.  Patient opts for every 3 year screening interval.  4) Osteoporosis  She reports that she has been diagnosed with osteoporosis by her physician and is taking calcium and prescription Vitamin D supplements. He suggested continuing on Estrace, and she would like a refill today for her prescription at the same dose.  5) Routine healthcare maintenance including cholesterol, diabetes screening managed by PCP  6) Colonoscopy: Last done in 2016 with finding of benign polyps. Due this year, referred to gastroenterology for colonoscopy.  7) Continues to smoke around 9 cigarettes daily, cessation encouraged.  8)  Follow up in 1 year for an annual exam.  Avel Sensor, CNM 09/20/2018  11:31 AM

## 2018-09-27 ENCOUNTER — Ambulatory Visit
Admission: RE | Admit: 2018-09-27 | Discharge: 2018-09-27 | Disposition: A | Discharge status: Home / Self Care | Payer: BLUE CROSS/BLUE SHIELD | Source: Ambulatory Visit | Attending: Maternal Newborn | Admitting: Maternal Newborn

## 2018-09-27 DIAGNOSIS — Z1231 Encounter for screening mammogram for malignant neoplasm of breast: Secondary | ICD-10-CM | POA: Diagnosis not present

## 2018-10-19 ENCOUNTER — Telehealth: Payer: Self-pay | Admitting: Gastroenterology

## 2018-10-19 ENCOUNTER — Other Ambulatory Visit: Payer: Self-pay | Admitting: Maternal Newborn

## 2018-10-19 ENCOUNTER — Telehealth: Payer: Self-pay

## 2018-10-19 DIAGNOSIS — N951 Menopausal and female climacteric states: Secondary | ICD-10-CM

## 2018-10-19 DIAGNOSIS — M810 Age-related osteoporosis without current pathological fracture: Secondary | ICD-10-CM

## 2018-10-19 NOTE — Telephone Encounter (Signed)
LVM for pt to call office back regarding scheduling her colonoscopy.  Thanks [employer]

## 2018-10-19 NOTE — Telephone Encounter (Signed)
Patient returning call to [facility] to schedule a colonoscopy from the work-q.

## 2018-10-19 NOTE — Telephone Encounter (Signed)
PT  Is calling for Sharyn Lull to schedule a colonoscopy

## 2018-10-23 ENCOUNTER — Other Ambulatory Visit: Payer: Self-pay

## 2018-10-23 DIAGNOSIS — Z86010 Personal history of colonic polyps: Secondary | ICD-10-CM

## 2018-10-23 NOTE — Telephone Encounter (Signed)
Colonoscopy has been scheduled with Dr. Allen Norris on 11/13/18 at [facility].  Thanks [employer]

## 2018-11-12 ENCOUNTER — Encounter: Payer: Self-pay | Admitting: Student

## 2018-11-13 ENCOUNTER — Ambulatory Visit: Payer: BLUE CROSS/BLUE SHIELD | Admitting: Anesthesiology

## 2018-11-13 ENCOUNTER — Ambulatory Visit
Admission: RE | Admit: 2018-11-13 | Discharge: 2018-11-13 | Disposition: A | Discharge status: Home / Self Care | Payer: BLUE CROSS/BLUE SHIELD | Attending: Gastroenterology | Admitting: Gastroenterology

## 2018-11-13 ENCOUNTER — Encounter
Admission: RE | Disposition: A | Discharge status: Home / Self Care | Payer: Self-pay | Source: Home / Self Care | Attending: Gastroenterology

## 2018-11-13 ENCOUNTER — Encounter: Payer: Self-pay | Admitting: Anesthesiology

## 2018-11-13 DIAGNOSIS — D120 Benign neoplasm of cecum: Secondary | ICD-10-CM | POA: Diagnosis not present

## 2018-11-13 DIAGNOSIS — M8580 Other specified disorders of bone density and structure, unspecified site: Secondary | ICD-10-CM | POA: Diagnosis not present

## 2018-11-13 DIAGNOSIS — F17210 Nicotine dependence, cigarettes, uncomplicated: Secondary | ICD-10-CM | POA: Insufficient documentation

## 2018-11-13 DIAGNOSIS — Z79890 Hormone replacement therapy (postmenopausal): Secondary | ICD-10-CM | POA: Insufficient documentation

## 2018-11-13 DIAGNOSIS — Z09 Encounter for follow-up examination after completed treatment for conditions other than malignant neoplasm: Secondary | ICD-10-CM | POA: Insufficient documentation

## 2018-11-13 DIAGNOSIS — Z860100 Personal history of colon polyps, unspecified: Secondary | ICD-10-CM

## 2018-11-13 DIAGNOSIS — Z79899 Other long term (current) drug therapy: Secondary | ICD-10-CM | POA: Diagnosis not present

## 2018-11-13 HISTORY — PX: COLONOSCOPY WITH PROPOFOL: SHX5780

## 2018-11-13 SURGERY — COLONOSCOPY WITH PROPOFOL
Anesthesia: General

## 2018-11-13 MED ORDER — PROPOFOL 10 MG/ML IV BOLUS
INTRAVENOUS | Status: AC
  Filled 2018-11-13: qty 40

## 2018-11-13 MED ORDER — PHENYLEPHRINE HCL 10 MG/ML IJ SOLN
INTRAMUSCULAR | Status: DC | PRN
  Administered 2018-11-13: 100 ug via INTRAVENOUS

## 2018-11-13 MED ORDER — SODIUM CHLORIDE 0.9 % IV SOLN
INTRAVENOUS | Status: DC
  Administered 2018-11-13: 11:00:00 via INTRAVENOUS

## 2018-11-13 MED ORDER — PROPOFOL 500 MG/50ML IV EMUL
INTRAVENOUS | Status: DC | PRN
  Administered 2018-11-13: 150 ug/kg/min via INTRAVENOUS

## 2018-11-13 MED ORDER — PROPOFOL 10 MG/ML IV BOLUS
INTRAVENOUS | Status: DC | PRN
  Administered 2018-11-13: 50 mg via INTRAVENOUS

## 2018-11-13 NOTE — Transfer of Care (Signed)
Immediate Anesthesia Transfer of Care Note  Patient: Julie Boyer  Procedure(s) Performed: COLONOSCOPY WITH PROPOFOL (N/A )  Patient Location: Endoscopy Unit  Anesthesia Type:General  Level of Consciousness: awake, alert  and oriented  Airway & Oxygen Therapy: Patient Spontanous Breathing and Patient connected to nasal cannula oxygen  Post-op Assessment: Report given to RN and Post -op Vital signs reviewed and stable  Post vital signs: Reviewed and stable  Last Vitals:  Vitals Value Taken Time  BP    Temp    Pulse    Resp    SpO2      Last Pain:  Vitals:   11/13/18 1016  TempSrc: Tympanic  PainSc: 0-No pain         Complications: No apparent anesthesia complications

## 2018-11-13 NOTE — Op Note (Signed)
[facility] Gastroenterology Patient Name: Julie Boyer Procedure Date: 11/13/2018 10:34 AM MRN: [id number] Account #: [id number] Date of Birth: October 07, 1961 Admit Type: Outpatient Age: 58 Room: [facility] ENDO ROOM 4 Gender: Female Note Status: Finalized Procedure:            Colonoscopy Indications:          High risk colon cancer surveillance: Personal history                        of colonic polyps 05/21/2015 Providers:            [name] MD, MD Medicines:            Propofol per Anesthesia Complications:        No immediate complications. Procedure:            Pre-Anesthesia Assessment:                       - Prior to the procedure, a History and Physical was                        performed, and patient medications and allergies were                        reviewed. The patient's tolerance of previous                        anesthesia was also reviewed. The risks and benefits of                        the procedure and the sedation options and risks were                        discussed with the patient. All questions were                        answered, and informed consent was obtained. Prior                        Anticoagulants: The patient has taken no previous                        anticoagulant or antiplatelet agents. ASA Grade                        Assessment: II - A patient with mild systemic disease.                        After reviewing the risks and benefits, the patient was                        deemed in satisfactory condition to undergo the                        procedure.                       After obtaining informed consent, the colonoscope was                        passed under direct vision. Throughout  the procedure,                        the patient's blood pressure, pulse, and oxygen                        saturations were monitored continuously. The                        Colonoscope was introduced through the anus and               advanced to the the cecum, identified by appendiceal                        orifice and ileocecal valve. The colonoscopy was                        performed without difficulty. The patient tolerated the                        procedure well. The quality of the bowel preparation                        was excellent. Findings:      The perianal and digital rectal examinations were normal.      A 5 mm polyp was found in the cecum. The polyp was sessile. The polyp       was removed with a cold snare. Resection and retrieval were complete. Impression:           - One 5 mm polyp in the cecum, removed with a cold                        snare. Resected and retrieved. Recommendation:       - Discharge patient to home.                       - Resume previous diet.                       - Continue present medications.                       - Await pathology results.                       - Repeat colonoscopy in 5 years for surveillance. Procedure Code(s):    --- Professional ---                       [phone], Colonoscopy, flexible; with removal of tumor(s),                        polyp(s), or other lesion(s) by snare technique Diagnosis Code(s):    --- Professional ---                       Z86.010, Personal history of colonic polyps                       D12.0, Benign neoplasm of cecum CPT copyright 2018 American Medical Association. All rights reserved. The codes documented in this report are preliminary and upon coder review  may  be revised to meet current compliance requirements. [name] MD, MD 11/13/2018 10:56:49 AM This report has been signed electronically. Number of Addenda: 0 Note Initiated On: 11/13/2018 10:34 AM Scope Withdrawal Time: 0 hours 7 minutes 9 seconds  Total Procedure Duration: 0 hours 11 minutes 30 seconds       [facility]

## 2018-11-13 NOTE — Anesthesia Preprocedure Evaluation (Signed)
Anesthesia Evaluation  Patient identified by MRN, date of birth, ID band Patient awake    Reviewed: Allergy & Precautions, NPO status , Patient's Chart, lab work & pertinent test results, reviewed documented beta blocker date and time   History of Anesthesia Complications (+) history of anesthetic complications  Airway Mallampati: II  TM Distance: >3 FB     Dental  (+) Chipped   Pulmonary Current Smoker,           Cardiovascular      Neuro/Psych PSYCHIATRIC DISORDERS Depression    GI/Hepatic   Endo/Other  Hypothyroidism   Renal/GU      Musculoskeletal  (+) Arthritis ,   Abdominal   Peds  Hematology   Anesthesia Other Findings   Reproductive/Obstetrics                             Anesthesia Physical Anesthesia Plan  ASA: III  Anesthesia Plan: General   Post-op Pain Management:    Induction: Intravenous  PONV Risk Score and Plan:   Airway Management Planned:   Additional Equipment:   Intra-op Plan:   Post-operative Plan:   Informed Consent: I have reviewed the patients History and Physical, chart, labs and discussed the procedure including the risks, benefits and alternatives for the proposed anesthesia with the patient or authorized representative who has indicated his/her understanding and acceptance.       Plan Discussed with: CRNA  Anesthesia Plan Comments:         Anesthesia Quick Evaluation

## 2018-11-13 NOTE — Anesthesia Post-op Follow-up Note (Signed)
Anesthesia QCDR form completed.        

## 2018-11-13 NOTE — Anesthesia Postprocedure Evaluation (Signed)
Anesthesia Post Note  Patient: Julie Boyer  Procedure(s) Performed: COLONOSCOPY WITH PROPOFOL (N/A )  Patient location during evaluation: Endoscopy Anesthesia Type: General Level of consciousness: awake and alert Pain management: pain level controlled Vital Signs Assessment: post-procedure vital signs reviewed and stable Respiratory status: spontaneous breathing, nonlabored ventilation, respiratory function stable and patient connected to nasal cannula oxygen Cardiovascular status: blood pressure returned to baseline and stable Postop Assessment: no apparent nausea or vomiting Anesthetic complications: no     Last Vitals:  Vitals:   11/13/18 1119 11/13/18 1129  BP: (!) 104/92 107/76  Pulse: 76 63  Resp: 15 17  Temp:    SpO2: 100% 100%    Last Pain:  Vitals:   11/13/18 1129  TempSrc:   PainSc: 0-No pain                 Zema Lizardo S

## 2018-11-13 NOTE — H&P (Signed)
[name], MD [facility] [address], [place], [place] [address] Phone:[phone] Fax : [phone]  Primary Care Physician:  Mar Daring, PA-C Primary Gastroenterologist:  Dr. Allen Norris  Pre-Procedure History & Physical: HPI:  Julie Boyer is a 58 y.o. female is here for an colonoscopy.   Past Medical History:  Diagnosis Date  . Arthritis    hands  . Complication of anesthesia    sneezing after last colonoscopy  . Endometriosis    uterus  . Family history of breast cancer    pt is BRCA neg, 11/18 updated genetic testing letter sent; IBIS=11.8%  . Genetic testing of female    BRCA neg  . History of mammogram 08/31/2011; 03/18/15   neg; benign  . History of Papanicolaou smear of cervix 06/22/11; 03/18/15   NEG; NEG  . Numbness and tingling in hands    pt thinks may be Carpal Tunnel issues  . Osteopenia 07/2015   spine - DR. MORAYATI  . Thyroid disorder   . Wears dentures    partial lower    Past Surgical History:  Procedure Laterality Date  . ABDOMINAL HYSTERECTOMY  11/171992   tab, bso - BVD  . COLONOSCOPY  05/21/2010   benign appearing polyp was found in the rectum  . COLONOSCOPY WITH PROPOFOL N/A 05/21/2015   Procedure: COLONOSCOPY WITH PROPOFOL;  Surgeon: [name], MD;  Location: [place];  Service: Endoscopy;  Laterality: N/A;  . KNEE SURGERY Right 03/2001; 03/2013; 2011   arthroscopic  . POLYPECTOMY  05/21/2015   Procedure: POLYPECTOMY;  Surgeon: [name], MD;  Location: [place];  Service: Endoscopy;;  . TUBAL LIGATION  10/01/1989    Prior to Admission medications   Medication Sig Start Date End Date Taking? Authorizing Provider  cetirizine (ZYRTEC) 10 MG tablet Take 1 tablet (10 mg total) by mouth daily. 12/20/17  Yes Fenton Malling M, PA-C  SYNTHROID 100 MCG tablet Take 1 tablet by mouth daily. 03/17/15  Yes [provider]  BIOTIN FORTE PO Take by mouth daily.    [provider]  buPROPion  (WELLBUTRIN SR) 150 MG 12 hr tablet Take 1 tablet (150 mg total) by mouth 2 (two) times daily. 09/21/17   Mar Daring, PA-C  Cyanocobalamin (VITAMIN B-12 PO) Take 1 tablet by mouth every 3 (three) days.     [provider]  estradiol (ESTRACE) 0.5 MG tablet TAKE 1 TABLET BY MOUTH EVERY DAY 10/19/18   Rexene Agent, CNM  Misc Natural Products (OSTEO BI-FLEX ADV DOUBLE ST PO) Take by mouth daily.    [provider]  predniSONE (DELTASONE) 10 MG tablet Start 60 mg po day one, then 50 mg po day two, taper by 10 mg daily until complete. Patient not taking: Reported on 09/20/2018 06/04/18   Marylene Land, NP    Allergies as of 10/23/2018 - Review Complete 09/20/2018  Allergen Reaction Noted  . Azithromycin Other (See Comments) 04/02/2015    Family History  Problem Relation Age of Onset  . Prostate cancer Father 74  . Bone cancer Father   . Cancer Father 66       KIDNEY  . Hypertension Father   . Breast cancer Sister 25       has had breast cancer twice  . Diabetes Mother        TYPE 2  . Alzheimer's disease Mother   . Cancer Brother 25       KIDNEY  . Lung cancer  Brother 66  . Breast cancer Maternal Aunt 80    Social History   Socioeconomic History  . Marital status: Single    Spouse name: Not on file  . Number of children: 3  . Years of education: Not on file  . Highest education level: Not on file  Occupational History  . Occupation: BUSINESS  Social Needs  . Financial resource strain: Not on file  . Food insecurity:    Worry: Not on file    Inability: Not on file  . Transportation needs:    Medical: Not on file    Non-medical: Not on file  Tobacco Use  . Smoking status: Current Every Day Smoker    Packs/day: 1.00    Years: 20.00    Pack years: 20.00    Types: Cigarettes  . Smokeless tobacco: Never Used  Substance and Sexual Activity  . Alcohol use: Yes    Alcohol/week: 6.0 standard drinks    Types: 6 Shots of liquor per week     Comment: OCCASIONALLY  . Drug use: No  . Sexual activity: Yes    Birth control/protection: Surgical  Lifestyle  . Physical activity:    Days per week: Not on file    Minutes per session: Not on file  . Stress: Not on file  Relationships  . Social connections:    Talks on phone: Not on file    Gets together: Not on file    Attends religious service: Not on file    Active member of club or organization: Not on file    Attends meetings of clubs or organizations: Not on file    Relationship status: Not on file  . Intimate partner violence:    Fear of current or ex partner: Not on file    Emotionally abused: Not on file    Physically abused: Not on file    Forced sexual activity: Not on file  Other Topics Concern  . Not on file  Social History Narrative  . Not on file    Review of Systems: See HPI, otherwise negative ROS  Physical Exam: BP (!) 94/57   Pulse 74   Temp (!) 97 F (36.1 C) (Tympanic)   Resp 16   Ht '5\' 2"'  (1.575 m)   Wt 65.3 kg   SpO2 100%   BMI 26.34 kg/m  General:   Alert,  pleasant and cooperative in NAD Head:  Normocephalic and atraumatic. Neck:  Supple; no masses or thyromegaly. Lungs:  Clear throughout to auscultation.    Heart:  Regular rate and rhythm. Abdomen:  Soft, nontender and nondistended. Normal bowel sounds, without guarding, and without rebound.   Neurologic:  Alert and  oriented x4;  grossly normal neurologically.  Impression/Plan: Julie Boyer is here for an colonoscopy to be performed for history of colon polyps  Risks, benefits, limitations, and alternatives regarding  colonoscopy have been reviewed with the patient.  Questions have been answered.  All parties agreeable.   [name], MD  11/13/2018, 11:01 AM

## 2018-11-14 ENCOUNTER — Encounter: Payer: Self-pay | Admitting: Gastroenterology

## 2018-11-14 LAB — SURGICAL PATHOLOGY

## 2018-12-06 ENCOUNTER — Other Ambulatory Visit: Payer: Self-pay | Admitting: Physician Assistant

## 2018-12-06 DIAGNOSIS — F32A Depression, unspecified: Secondary | ICD-10-CM

## 2018-12-06 DIAGNOSIS — F329 Major depressive disorder, single episode, unspecified: Secondary | ICD-10-CM

## 2018-12-14 NOTE — Progress Notes (Deleted)
       Patient: Julie Boyer Female    DOB: 1961/01/23   58 y.o.   MRN: [id number] Visit Date: 12/14/2018  Today's Provider: Mar Daring, PA-C   No chief complaint on file.  Subjective:    I, Julie Boyer, CMA, am acting as a Education administrator for [employer], PA-C.  HPI Upper Respiratory Infection: Patient complains of {uri rfv:14243::"symptoms of a URI"}. Symptoms include {otitis symptoms:327}. Onset of symptoms was {numbers; 0-10:33138} {time units:11} ago, {clinical course - history:17::"unchanged"} since that time. She also c/o {uri sx:15453} for the past {numbers 1-16:15321} {time; units w/plural:11} .  She {hydration history:15378}. Evaluation to date: {uri eval:14242::"none"}. Treatment to date: {uri tx:14268}.    Allergies  Allergen Reactions  . Azithromycin Other (See Comments)    Z-Pak: Red splotches     Current Outpatient Medications:  .  BIOTIN FORTE PO, Take by mouth daily., Disp: , Rfl:  .  buPROPion (WELLBUTRIN SR) 150 MG 12 hr tablet, TAKE 1 TABLET BY MOUTH TWICE A DAY, Disp: 180 tablet, Rfl: 3 .  cetirizine (ZYRTEC) 10 MG tablet, Take 1 tablet (10 mg total) by mouth daily., Disp: 30 tablet, Rfl: 0 .  Cyanocobalamin (VITAMIN B-12 PO), Take 1 tablet by mouth every 3 (three) days. , Disp: , Rfl:  .  estradiol (ESTRACE) 0.5 MG tablet, TAKE 1 TABLET BY MOUTH EVERY DAY, Disp: 90 tablet, Rfl: 4 .  Misc Natural Products (OSTEO BI-FLEX ADV DOUBLE ST PO), Take by mouth daily., Disp: , Rfl:  .  predniSONE (DELTASONE) 10 MG tablet, Start 60 mg po day one, then 50 mg po day two, taper by 10 mg daily until complete. (Patient not taking: Reported on 09/20/2018), Disp: 21 tablet, Rfl: 0 .  SYNTHROID 100 MCG tablet, Take 1 tablet by mouth daily., Disp: , Rfl: 11  Review of Systems  Social History   Tobacco Use  . Smoking status: Current Every Day Smoker    Packs/day: 1.00    Years: 20.00    Pack years: 20.00    Types: Cigarettes  . Smokeless tobacco: Never Used    Substance Use Topics  . Alcohol use: Yes    Alcohol/week: 6.0 standard drinks    Types: 6 Shots of liquor per week    Comment: OCCASIONALLY      Objective:   There were no vitals taken for this visit. There were no vitals filed for this visit.   Physical Exam      Assessment & Loving, PA-C  [facility]

## 2018-12-17 ENCOUNTER — Ambulatory Visit: Payer: Self-pay | Admitting: Physician Assistant

## 2018-12-19 ENCOUNTER — Ambulatory Visit (INDEPENDENT_AMBULATORY_CARE_PROVIDER_SITE_OTHER): Payer: BLUE CROSS/BLUE SHIELD | Admitting: Physician Assistant

## 2018-12-19 ENCOUNTER — Encounter: Payer: Self-pay | Admitting: Physician Assistant

## 2018-12-19 VITALS — BP 122/79 | HR 77 | Temp 98.7°F | Resp 16 | Wt 140.0 lb

## 2018-12-19 DIAGNOSIS — J0100 Acute maxillary sinusitis, unspecified: Secondary | ICD-10-CM | POA: Diagnosis not present

## 2018-12-19 MED ORDER — AMOXICILLIN-POT CLAVULANATE 875-125 MG PO TABS: 1.0000 | ORAL_TABLET | Freq: Two times a day (BID) | ORAL | 0 refills | Status: DC

## 2018-12-19 MED ORDER — BENZONATATE 200 MG PO CAPS: 200.0000 mg | ORAL_CAPSULE | Freq: Three times a day (TID) | ORAL | 0 refills | Status: DC | PRN

## 2018-12-19 NOTE — Progress Notes (Signed)
Patient: Julie Boyer Female    DOB: 06/22/1961   58 y.o.   MRN: [id number] Visit Date: 12/19/2018  Today's Provider: Mar Daring, PA-C   Chief Complaint  Patient presents with  . URI   Subjective:    I, Sulibeya S. Dimas, CMA, am acting as a Education administrator for [employer], PA-C.  HPI Upper Respiratory Infection: Patient complains of symptoms of a URI, possible sinusitis. Symptoms include congestion and cough. Onset of symptoms was 3 weeks ago, gradually worsening since that time. She also c/o cough described as productive of yellow sputum for the past 1 week .  She is drinking plenty of fluids. Evaluation to date: none. Treatment to date: cough suppressants.    Allergies  Allergen Reactions  . Azithromycin Other (See Comments)    Z-Pak: Red splotches     Current Outpatient Medications:  .  BIOTIN FORTE PO, Take by mouth daily., Disp: , Rfl:  .  buPROPion (WELLBUTRIN SR) 150 MG 12 hr tablet, TAKE 1 TABLET BY MOUTH TWICE A DAY, Disp: 180 tablet, Rfl: 3 .  cetirizine (ZYRTEC) 10 MG tablet, Take 1 tablet (10 mg total) by mouth daily., Disp: 30 tablet, Rfl: 0 .  Cyanocobalamin (VITAMIN B-12 PO), Take 1 tablet by mouth every 3 (three) days. , Disp: , Rfl:  .  D3-50 1.25 MG (50000 UT) capsule, TAKE 1 CAPSULE BY MOUTH WEEKLY, Disp: , Rfl:  .  estradiol (ESTRACE) 0.5 MG tablet, TAKE 1 TABLET BY MOUTH EVERY DAY, Disp: 90 tablet, Rfl: 4 .  Misc Natural Products (OSTEO BI-FLEX ADV DOUBLE ST PO), Take by mouth daily., Disp: , Rfl:  .  SYNTHROID 100 MCG tablet, Take 1 tablet by mouth daily., Disp: , Rfl: 11  Review of Systems  Constitutional: Negative.   HENT: Positive for congestion, sinus pressure, sinus pain and sore throat.   Respiratory: Positive for cough, shortness of breath and wheezing.   Cardiovascular: Negative.   Neurological: Negative.     Social History   Tobacco Use  . Smoking status: Current Every Day Smoker    Packs/day: 1.00    Years: 20.00   Pack years: 20.00    Types: Cigarettes  . Smokeless tobacco: Never Used  Substance Use Topics  . Alcohol use: Yes    Alcohol/week: 6.0 standard drinks    Types: 6 Shots of liquor per week    Comment: OCCASIONALLY      Objective:   BP 122/79 (BP Location: Left Arm, Patient Position: Sitting, Cuff Size: Normal)   Pulse 77   Temp 98.7 F (37.1 C) (Oral)   Resp 16   Wt 140 lb (63.5 kg)   SpO2 98%   BMI 25.61 kg/m  Vitals:   12/19/18 0837  BP: 122/79  Pulse: 77  Resp: 16  Temp: 98.7 F (37.1 C)  TempSrc: Oral  SpO2: 98%  Weight: 140 lb (63.5 kg)     Physical Exam Vitals signs reviewed.  Constitutional:      General: She is not in acute distress.    Appearance: Normal appearance. She is well-developed and normal weight. She is not ill-appearing or diaphoretic.  HENT:     Head: Normocephalic and atraumatic.     Right Ear: Hearing, tympanic membrane, ear canal and external ear normal.     Left Ear: Hearing, tympanic membrane, ear canal and external ear normal.     Nose: Congestion and rhinorrhea present.     Right Sinus: Maxillary sinus  tenderness and frontal sinus tenderness present.     Left Sinus: Maxillary sinus tenderness and frontal sinus tenderness present.     Mouth/Throat:     Mouth: Mucous membranes are moist.     Pharynx: Uvula midline. Posterior oropharyngeal erythema present. No oropharyngeal exudate.  Neck:     Musculoskeletal: Normal range of motion and neck supple.     Thyroid: No thyromegaly.     Trachea: No tracheal deviation.  Cardiovascular:     Rate and Rhythm: Normal rate and regular rhythm.     Heart sounds: Normal heart sounds. No murmur. No friction rub. No gallop.   Pulmonary:     Effort: Pulmonary effort is normal. No respiratory distress.     Breath sounds: Normal breath sounds. No stridor. No wheezing or rales.  Lymphadenopathy:     Cervical: No cervical adenopathy.  Neurological:     Mental Status: She is alert.          Assessment & Plan    1. Acute non-recurrent maxillary sinusitis Worsening symptoms that have not responded to OTC medications. Will give augmentin as below. Tessalon perles for cough as below. Continue allergy medications. Stay well hydrated and get plenty of rest. Call if no symptom improvement or if symptoms worsen. - amoxicillin-clavulanate (AUGMENTIN) 875-125 MG tablet; Take 1 tablet by mouth 2 (two) times daily.  Dispense: 20 tablet; Refill: 0 - benzonatate (TESSALON) 200 MG capsule; Take 1 capsule (200 mg total) by mouth 3 (three) times daily as needed.  Dispense: 30 capsule; Refill: 0     Mar Daring, PA-C  [facility]

## 2018-12-19 NOTE — Patient Instructions (Signed)
Sinusitis, Adult  Sinusitis is inflammation of your sinuses. Sinuses are hollow spaces in the bones around your face. Your sinuses are located:   Around your eyes.   In the middle of your forehead.   Behind your nose.   In your cheekbones.  Mucus normally drains out of your sinuses. When your nasal tissues become inflamed or swollen, mucus can become trapped or blocked. This allows bacteria, viruses, and fungi to grow, which leads to infection. Most infections of the sinuses are caused by a virus.  Sinusitis can develop quickly. It can last for up to 4 weeks (acute) or for more than 12 weeks (chronic). Sinusitis often develops after a cold.  What are the causes?  This condition is caused by anything that creates swelling in the sinuses or stops mucus from draining. This includes:   Allergies.   Asthma.   Infection from bacteria or viruses.   Deformities or blockages in your nose or sinuses.   Abnormal growths in the nose (nasal polyps).   Pollutants, such as chemicals or irritants in the air.   Infection from fungi (rare).  What increases the risk?  You are more likely to develop this condition if you:   Have a weak body defense system (immune system).   Do a lot of swimming or diving.   Overuse nasal sprays.   Smoke.  What are the signs or symptoms?  The main symptoms of this condition are pain and a feeling of pressure around the affected sinuses. Other symptoms include:   Stuffy nose or congestion.   Thick drainage from your nose.   Swelling and warmth over the affected sinuses.   Headache.   Upper toothache.   A cough that may get worse at night.   Extra mucus that collects in the throat or the back of the nose (postnasal drip).   Decreased sense of smell and taste.   Fatigue.   A fever.   Sore throat.   Bad breath.  How is this diagnosed?  This condition is diagnosed based on:   Your symptoms.   Your medical history.   A physical exam.   Tests to find out if your condition is  acute or chronic. This may include:  ? Checking your nose for nasal polyps.  ? Viewing your sinuses using a device that has a light (endoscope).  ? Testing for allergies or bacteria.  ? Imaging tests, such as an MRI or CT scan.  In rare cases, a bone biopsy may be done to rule out more serious types of fungal sinus disease.  How is this treated?  Treatment for sinusitis depends on the cause and whether your condition is chronic or acute.   If caused by a virus, your symptoms should go away on their own within 10 days. You may be given medicines to relieve symptoms. They include:  ? Medicines that shrink swollen nasal passages (topical intranasal decongestants).  ? Medicines that treat allergies (antihistamines).  ? A spray that eases inflammation of the nostrils (topical intranasal corticosteroids).  ? Rinses that help get rid of thick mucus in your nose (nasal saline washes).   If caused by bacteria, your health care provider may recommend waiting to see if your symptoms improve. Most bacterial infections will get better without antibiotic medicine. You may be given antibiotics if you have:  ? A severe infection.  ? A weak immune system.   If caused by narrow nasal passages or nasal polyps, you may need   to have surgery.  Follow these instructions at home:  Medicines   Take, use, or apply over-the-counter and prescription medicines only as told by your health care provider. These may include nasal sprays.   If you were prescribed an antibiotic medicine, take it as told by your health care provider. Do not stop taking the antibiotic even if you start to feel better.  Hydrate and humidify     Drink enough fluid to keep your urine pale yellow. Staying hydrated will help to thin your mucus.   Use a cool mist humidifier to keep the humidity level in your home above 50%.   Inhale steam for 10-15 minutes, 3-4 times a day, or as told by your health care provider. You can do this in the bathroom while a hot shower is  running.   Limit your exposure to cool or dry air.  Rest   Rest as much as possible.   Sleep with your head raised (elevated).   Make sure you get enough sleep each night.  General instructions     Apply a warm, moist washcloth to your face 3-4 times a day or as told by your health care provider. This will help with discomfort.   Wash your hands often with soap and water to reduce your exposure to germs. If soap and water are not available, use hand sanitizer.   Do not smoke. Avoid being around people who are smoking (secondhand smoke).   Keep all follow-up visits as told by your health care provider. This is important.  Contact a health care provider if:   You have a fever.   Your symptoms get worse.   Your symptoms do not improve within 10 days.  Get help right away if:   You have a severe headache.   You have persistent vomiting.   You have severe pain or swelling around your face or eyes.   You have vision problems.   You develop confusion.   Your neck is stiff.   You have trouble breathing.  Summary   Sinusitis is soreness and inflammation of your sinuses. Sinuses are hollow spaces in the bones around your face.   This condition is caused by nasal tissues that become inflamed or swollen. The swelling traps or blocks the flow of mucus. This allows bacteria, viruses, and fungi to grow, which leads to infection.   If you were prescribed an antibiotic medicine, take it as told by your health care provider. Do not stop taking the antibiotic even if you start to feel better.   Keep all follow-up visits as told by your health care provider. This is important.  This information is not intended to replace advice given to you by your health care provider. Make sure you discuss any questions you have with your health care provider.  Document Released: 09/26/2005 Document Revised: 02/26/2018 Document Reviewed: 02/26/2018  Elsevier Interactive Patient Education  2019 [employer]

## 2019-02-04 IMAGING — US US EXTREM LOW VENOUS*L*
1 series · 13 of 24 positions shown · non-contrast
Comparison: None.

CLINICAL DATA: 57-year-old female with left lower extremity
swelling and pain for the past month



[Series 1: us extrem low venous*left* · 0.06mm/px · 13 of 55 slices shown]
[im 1/55]
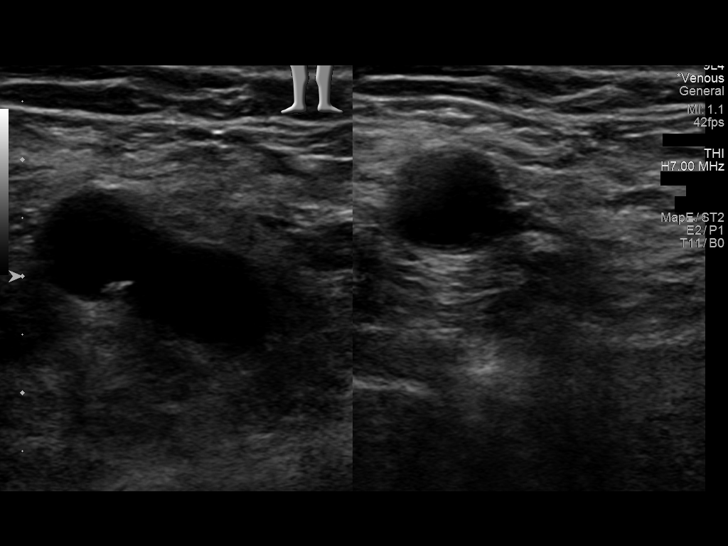
[im 5/55]
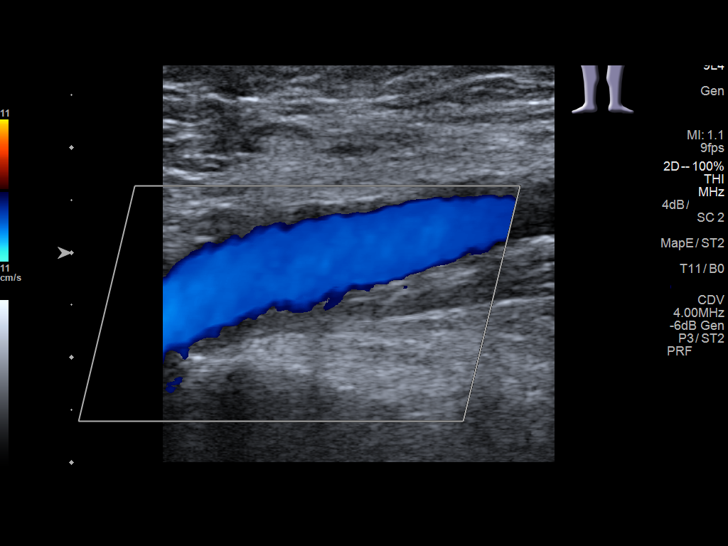
[im 10/55]
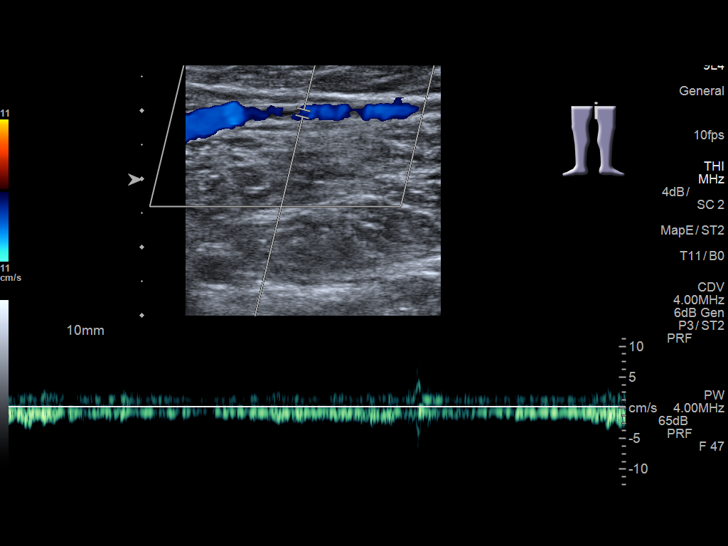
[im 15/55]
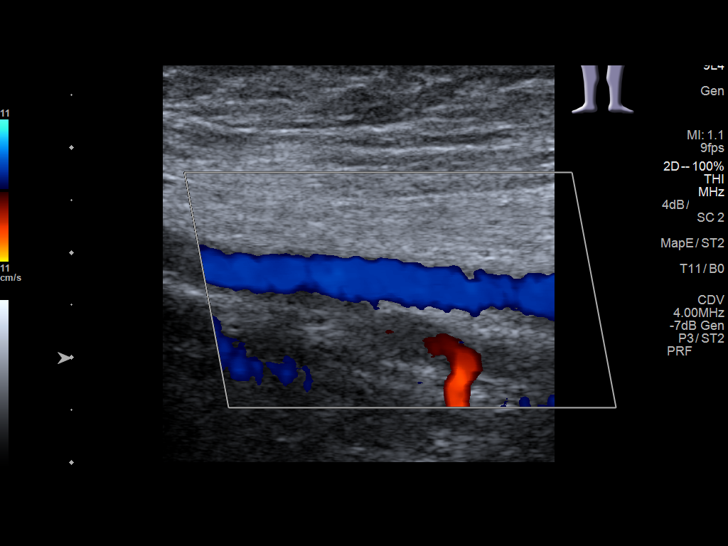
[im 19/55]
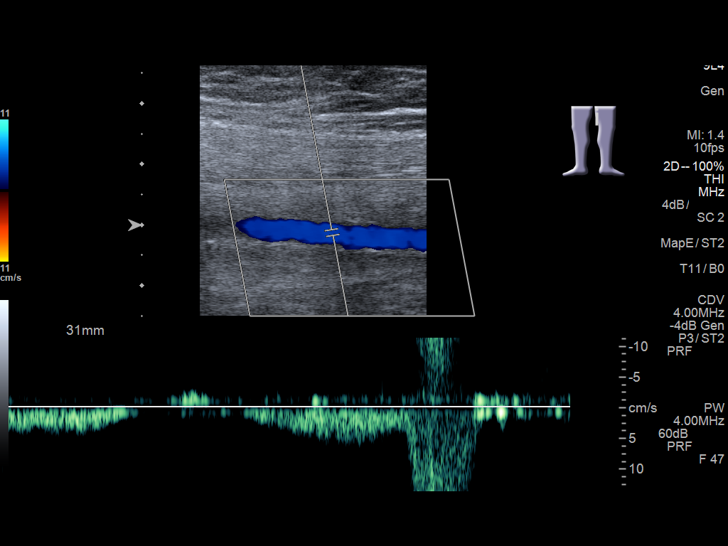
[im 24/55]
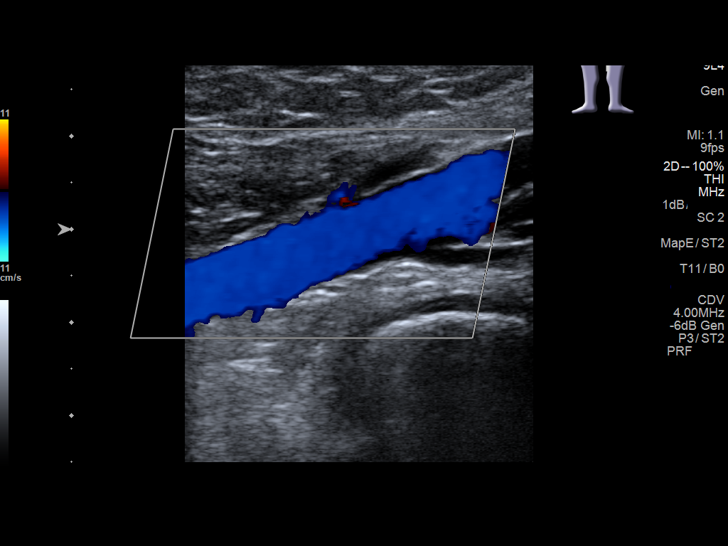
[im 29/55]
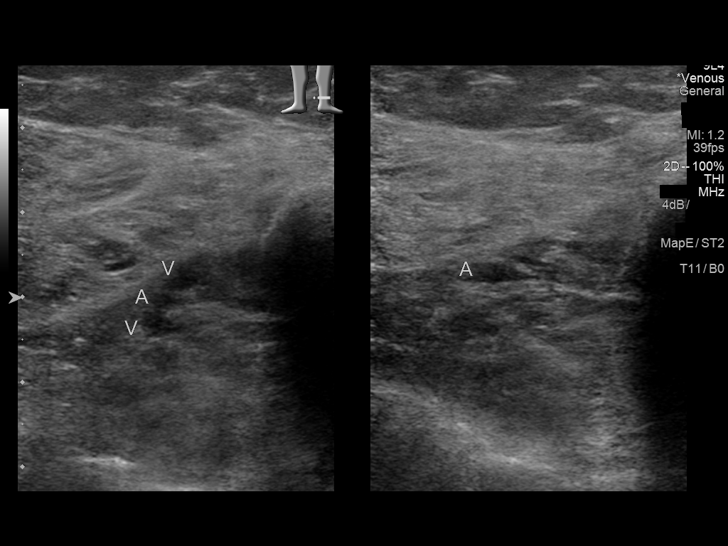
[im 31/55]
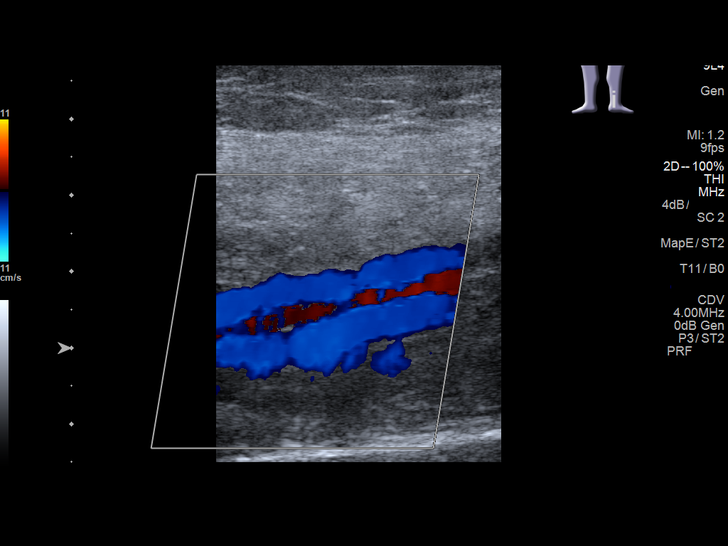
[im 36/55]
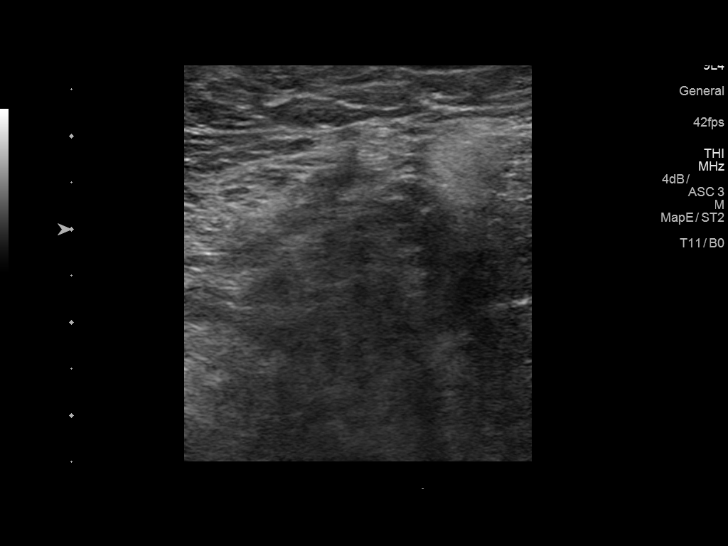
[im 40/55]
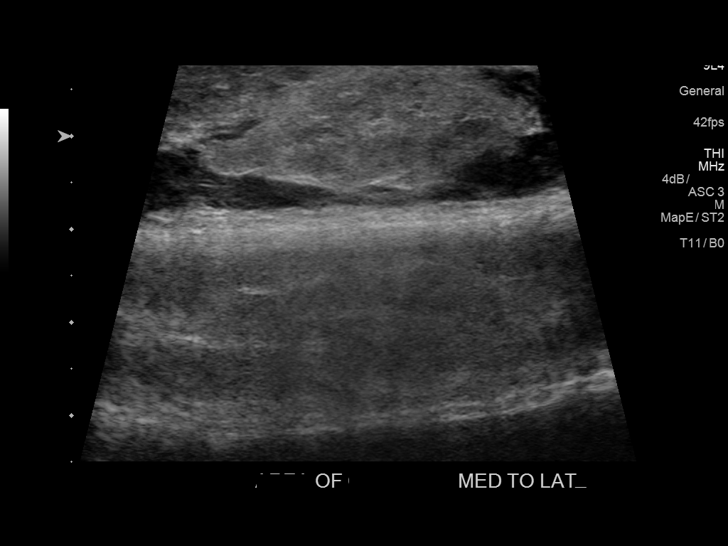
[im 45/55]
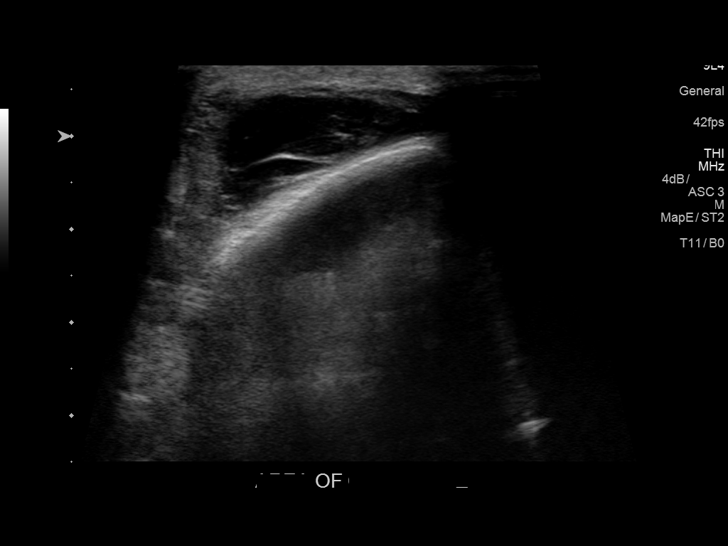
[im 50/55]
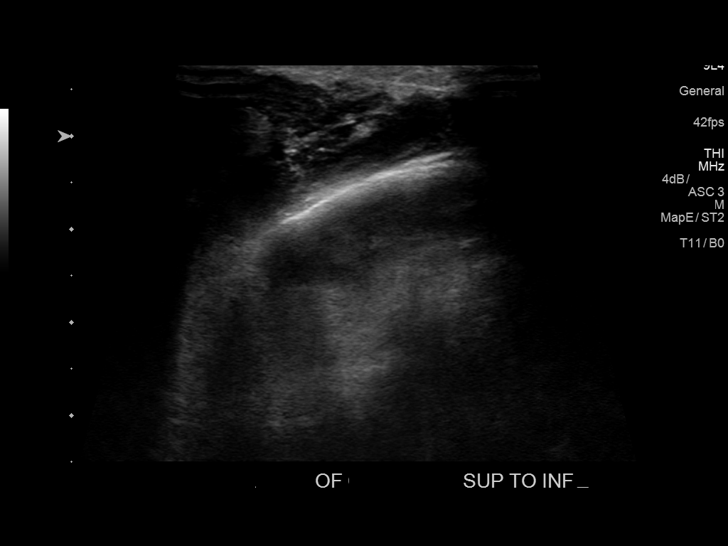
[im 55/55]
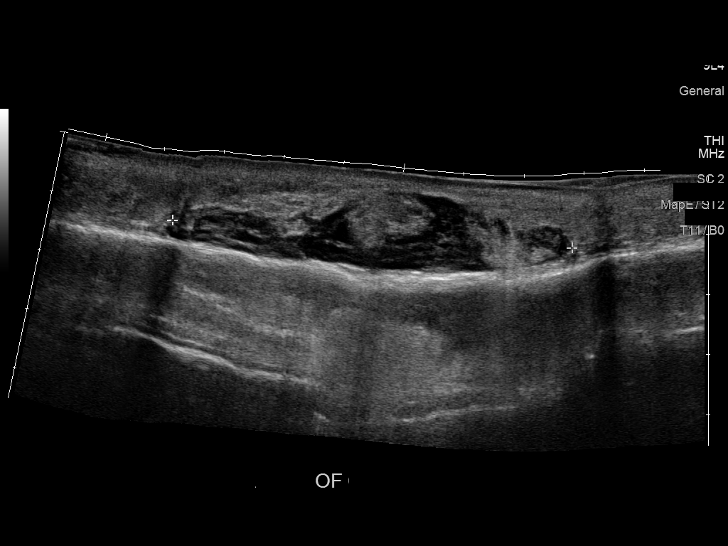

[13 of 24 positions shown; findings below may reference images not displayed]

FINDINGS: Contralateral Common Femoral Vein: Respiratory phasicity is normal
and symmetric with the symptomatic side. No evidence of thrombus.
Normal compressibility.

Common Femoral Vein: No evidence of thrombus. Normal
compressibility, respiratory phasicity and response to augmentation.

Saphenofemoral Junction: No evidence of thrombus. Normal
compressibility and flow on color Doppler imaging.

Profunda Femoral Vein: No evidence of thrombus. Normal
compressibility and flow on color Doppler imaging.

Femoral Vein: No evidence of thrombus. Normal compressibility,
respiratory phasicity and response to augmentation.

Popliteal Vein: No evidence of thrombus. Normal compressibility,
respiratory phasicity and response to augmentation.

Calf Veins: No evidence of thrombus. Normal compressibility and flow
on color Doppler imaging.

Superficial Great Saphenous Vein: No evidence of thrombus. Normal
compressibility.

Venous Reflux:  None.

Other Findings: In the region of clinical concern there is a
heterogeneous complex cystic structure measuring approximately 6.4 x
1.4 cm in the superficial soft tissues overlying the medial aspect
of the left calf. There is no evidence of internal vascularity on
color Doppler evaluation.
IMPRESSION: 1. No evidence of deep venous thrombosis.
2. Complex fluid collection in the superficial soft tissues of the
medial aspect of the left calf. No evidence of internal vascularity
to suggest that this represents a soft tissue mass. Differential
considerations include hematoma, and in the appropriate clinical
setting, abscess. Hematoma is favored.

## 2019-11-20 ENCOUNTER — Other Ambulatory Visit: Payer: Self-pay | Admitting: Physician Assistant

## 2019-11-20 DIAGNOSIS — F32A Depression, unspecified: Secondary | ICD-10-CM

## 2019-11-20 DIAGNOSIS — F329 Major depressive disorder, single episode, unspecified: Secondary | ICD-10-CM

## 2019-11-20 NOTE — Telephone Encounter (Signed)
Requested medication (s) are due for refill today: yes  Requested medication (s) are on the active medication list: yes  Last refill:  08/20/2019  Future visit scheduled: no  Notes to clinic:  due for PHQ-2 No valid encounter within last  6 months   Requested Prescriptions  Pending Prescriptions Disp Refills   buPROPion (WELLBUTRIN SR) 150 MG 12 hr tablet [Pharmacy Med Name: BUPROPION HCL SR 150 MG TABLET] 180 tablet 3    Sig: TAKE 1 TABLET BY MOUTH TWICE A DAY      Psychiatry: Antidepressants - bupropion Failed - 11/20/2019  2:29 AM      Failed - Completed PHQ-2 or PHQ-9 in the last 360 days.      Failed - Valid encounter within last 6 months    Recent Outpatient Visits           11 months ago Acute non-recurrent maxillary sinusitis   [facility], [place], [place]   1 year ago Painful urination   [place], [place], [place]   1 year ago Acute maxillary sinusitis, recurrence not specified   [place], [place]   2 years ago Acute maxillary sinusitis, recurrence not specified   [place], [place]   2 years ago Acute pansinusitis, recurrence not specified   [place], [place]              Passed - Last BP in normal range    BP Readings from Last 1 Encounters:  12/19/18 122/79

## 2019-12-13 ENCOUNTER — Other Ambulatory Visit: Payer: Self-pay | Admitting: Physician Assistant

## 2019-12-13 ENCOUNTER — Telehealth: Payer: Self-pay | Admitting: *Deleted

## 2019-12-13 DIAGNOSIS — F32A Depression, unspecified: Secondary | ICD-10-CM

## 2019-12-13 DIAGNOSIS — F329 Major depressive disorder, single episode, unspecified: Secondary | ICD-10-CM

## 2019-12-13 NOTE — Telephone Encounter (Signed)
Attempted to call patient to schedule an appointment for refills. No answer left VM to call back and schedule an appointment.

## 2020-01-20 ENCOUNTER — Other Ambulatory Visit: Payer: Self-pay | Admitting: Physician Assistant

## 2020-01-20 DIAGNOSIS — F32A Depression, unspecified: Secondary | ICD-10-CM

## 2020-01-20 DIAGNOSIS — F329 Major depressive disorder, single episode, unspecified: Secondary | ICD-10-CM

## 2020-01-20 NOTE — Telephone Encounter (Signed)
Requested medication (s) are due for refill today - yes  Requested medication (s) are on the active medication list -yes  Future visit scheduled -no  Last refill: 11/20/19  Notes to clinic: Attempted to call patient to schedule appointment- left message to call back for appointment- courtesy RF already given with pharmacy note last RF  Requested Prescriptions  Pending Prescriptions Disp Refills   buPROPion (WELLBUTRIN SR) 150 MG 12 hr tablet [Pharmacy Med Name: BUPROPION HCL SR 150 MG TABLET] 60 tablet 0    Sig: TAKE 1 TABLET BY MOUTH TWICE A DAY      Psychiatry: Antidepressants - bupropion Failed - 01/20/2020  3:10 PM      Failed - Completed PHQ-2 or PHQ-9 in the last 360 days.      Failed - Valid encounter within last 6 months    Recent Outpatient Visits           1 year ago Acute non-recurrent maxillary sinusitis   [facility], [place], [place]   1 year ago Painful urination   [facility] [place], [place]   2 years ago Acute maxillary sinusitis, recurrence not specified   [place], [place], [place]   2 years ago Acute maxillary sinusitis, recurrence not specified   [facility], [place], [place]   2 years ago Acute pansinusitis, recurrence not specified   [facility] [place], [place]              Passed - Last BP in normal range    BP Readings from Last 1 Encounters:  12/19/18 122/79              Requested Prescriptions  Pending Prescriptions Disp Refills   buPROPion (WELLBUTRIN SR) 150 MG 12 hr tablet [Pharmacy Med Name: BUPROPION HCL SR 150 MG TABLET] 60 tablet 0    Sig: TAKE 1 TABLET BY MOUTH TWICE A DAY      Psychiatry: Antidepressants - bupropion Failed - 01/20/2020  3:10 PM      Failed - Completed PHQ-2 or PHQ-9 in the last 360 days.      Failed - Valid encounter within last 6 months    Recent Outpatient Visits           1 year  ago Acute non-recurrent maxillary sinusitis   [facility] [place], [place], [place]   1 year ago Painful urination   [facility] [place], [place]   2 years ago Acute maxillary sinusitis, recurrence not specified   [place], [place]   2 years ago Acute maxillary sinusitis, recurrence not specified   [place], [place]   2 years ago Acute pansinusitis, recurrence not specified   [place], [place]              Passed - Last BP in normal range    BP Readings from Last 1 Encounters:  12/19/18 122/79

## 2020-02-25 ENCOUNTER — Other Ambulatory Visit: Payer: Self-pay | Admitting: Physician Assistant

## 2020-02-25 DIAGNOSIS — F32A Depression, unspecified: Secondary | ICD-10-CM

## 2020-04-20 LAB — HM DEXA SCAN

## 2020-04-23 ENCOUNTER — Encounter: Payer: Self-pay | Admitting: Physician Assistant

## 2021-07-24 ENCOUNTER — Other Ambulatory Visit: Payer: Self-pay

## 2021-07-24 ENCOUNTER — Ambulatory Visit
Admission: EM | Admit: 2021-07-24 | Discharge: 2021-07-24 | Disposition: A | Discharge status: Home / Self Care | Payer: BLUE CROSS/BLUE SHIELD | Attending: Physician Assistant | Admitting: Physician Assistant

## 2021-07-24 DIAGNOSIS — G8929 Other chronic pain: Secondary | ICD-10-CM | POA: Diagnosis not present

## 2021-07-24 DIAGNOSIS — M25561 Pain in right knee: Secondary | ICD-10-CM | POA: Diagnosis not present

## 2021-07-24 DIAGNOSIS — M5431 Sciatica, right side: Secondary | ICD-10-CM | POA: Diagnosis not present

## 2021-07-24 MED ORDER — TRAMADOL HCL 50 MG PO TABS: 50.0000 mg | ORAL_TABLET | Freq: Four times a day (QID) | ORAL | 0 refills | Status: DC | PRN

## 2021-07-24 MED ORDER — PREDNISONE 10 MG PO TABS: ORAL_TABLET | ORAL | 0 refills | Status: DC

## 2021-07-24 NOTE — ED Triage Notes (Addendum)
Pt reports pain past few months but worse past couple weeks. Pain is just below right knee laterally and shoots up leg to right hip area just below buttocks. No injury. Has had knee surgery to right knee previously

## 2021-07-24 NOTE — Discharge Instructions (Addendum)
-  You are likely having a flareup of your chronic knee condition causing the swelling of the knee.  Keep the area elevated and ice it.  You can apply topical Voltaren gel.  Tylenol for pain relief if needed.  I have also sent Ultram for any severe pain. -You additionally appear to have sciatica.  The prednisone should help with this as well as the knee swelling. -Keep follow-up appointment with Ortho but if any severe acute worsening of symptoms before you can see then you can go to [facility] walk-in urgent care in [place] or the emergency department.

## 2021-07-24 NOTE — ED Provider Notes (Signed)
[facility]    CSN: [id number] Arrival date & time: 07/24/21  1207      History   Chief Complaint Chief Complaint  Patient presents with   Leg Pain    HPI Julie Boyer is a 60 y.o. female presenting for right knee pain for the past couple of months.  Patient says the knee pain and swelling has gotten worse over the past week or so.  She also admits to pain in the right buttocks and right posterior thigh.  Denies any back pain.  Patient admits to history of multiple knee surgeries.  She has been taking over-the-counter Tylenol and ibuprofen without improvement in her symptoms.  Patient says last knee surgery was about 9 years ago.  She denies any associated numbness, weakness or tingling.  Patient does have an appointment with orthopedics in 2 weeks but says the pain has gotten so much worse this week that she wanted to be seen a little sooner.  Patient does not report leg weakness, bowel or bladder incontinence or saddle anesthesia.  No other complaints.  HPI  Past Medical History:  Diagnosis Date   Arthritis    hands   Complication of anesthesia    sneezing after last colonoscopy   Endometriosis    uterus   Family history of breast cancer    pt is BRCA neg, 11/18 updated genetic testing letter sent; IBIS=11.8%   Genetic testing of female    BRCA neg   History of mammogram 08/31/2011; 03/18/15   neg; benign   History of Papanicolaou smear of cervix 06/22/11; 03/18/15   NEG; NEG   Numbness and tingling in hands    pt thinks may be Carpal Tunnel issues   Osteopenia 07/2015   spine - DR. MORAYATI   Thyroid disorder    Wears dentures    partial lower    Patient Active Problem List   Diagnosis Date Noted   History of colonic polyps    Hx of colonic polyps    Benign neoplasm of cecum    Depression 05/01/2015   Hypothyroidism 04/02/2015   Sinus trouble 04/02/2015   Fatigue 04/02/2015    Past Surgical History:  Procedure Laterality Date   ABDOMINAL  HYSTERECTOMY  11/171992   tab, bso - BVD   COLONOSCOPY  05/21/2010   benign appearing polyp was found in the rectum   COLONOSCOPY WITH PROPOFOL N/A 05/21/2015   Procedure: COLONOSCOPY WITH PROPOFOL;  Surgeon: [name], MD;  Location: [place];  Service: Endoscopy;  Laterality: N/A;   COLONOSCOPY WITH PROPOFOL N/A 11/13/2018   Procedure: COLONOSCOPY WITH PROPOFOL;  Surgeon: [name], MD;  Location: [facility] ENDOSCOPY;  Service: Endoscopy;  Laterality: N/A;   KNEE SURGERY Right 03/2001; 03/2013; 2011   arthroscopic   POLYPECTOMY  05/21/2015   Procedure: POLYPECTOMY;  Surgeon: [name], MD;  Location: [place];  Service: Endoscopy;;   TUBAL LIGATION  10/01/1989    OB History     Gravida  3   Para  2   Term  2   Preterm      AB  1   Living  2      SAB  1   IAB      Ectopic      Multiple      Live Births  2            Home Medications    Prior to Admission medications   Medication Sig Start Date End  Date Taking? Authorizing Provider  predniSONE (DELTASONE) 10 MG tablet Take 6 tabs PO on day 1 and decrease by 1 tab daily until complete 07/24/21  Yes Laurene Footman B, PA-C  traMADol (ULTRAM) 50 MG tablet Take 1 tablet (50 mg total) by mouth every 6 (six) hours as needed. 07/24/21  Yes Laurene Footman B, PA-C  BIOTIN FORTE PO Take by mouth daily.    [provider]  buPROPion [facility] SR) 150 MG 12 hr tablet TAKE 1 TABLET BY MOUTH TWICE A DAY 01/21/20   Mar Daring, PA-C  cetirizine (ZYRTEC) 10 MG tablet Take 1 tablet (10 mg total) by mouth daily. 12/20/17   Mar Daring, PA-C  Cyanocobalamin (VITAMIN B-12 PO) Take 1 tablet by mouth every 3 (three) days.     [provider]  D3-50 1.25 MG (50000 UT) capsule TAKE 1 CAPSULE BY MOUTH WEEKLY 11/18/18   [provider]  estradiol (ESTRACE) 0.5 MG tablet TAKE 1 TABLET BY MOUTH EVERY DAY 10/19/18   Rexene Agent, CNM  Misc Natural Products (OSTEO BI-FLEX ADV  DOUBLE ST PO) Take by mouth daily.    [provider]  SYNTHROID 100 MCG tablet Take 110 mcg by mouth daily. 03/17/15   [provider]    Family History Family History  Problem Relation Age of Onset   Prostate cancer Father 56   Bone cancer Father    Cancer Father 55       KIDNEY   Hypertension Father    Breast cancer Sister 30       has had breast cancer twice   Diabetes Mother        TYPE 2   Alzheimer's disease Mother    Cancer Brother 76       KIDNEY   Lung cancer Brother 24   Breast cancer Maternal Aunt 80    Social History Social History   Tobacco Use   Smoking status: Every Day    Packs/day: 1.00    Years: 20.00    Pack years: 20.00    Types: Cigarettes   Smokeless tobacco: Never  Vaping Use   Vaping Use: Never used  Substance Use Topics   Alcohol use: Yes    Alcohol/week: 6.0 standard drinks    Types: 6 Shots of liquor per week    Comment: OCCASIONALLY   Drug use: No     Allergies   Azithromycin   Review of Systems Review of Systems  Constitutional:  Negative for fatigue and fever.  Gastrointestinal:  Negative for abdominal distention.  Genitourinary:  Negative for dysuria and flank pain.  Musculoskeletal:  Positive for arthralgias (Right knee pain and swelling). Negative for back pain and gait problem.  Skin:  Negative for rash and wound.  Neurological:  Negative for weakness and numbness.    Physical Exam Triage Vital Signs ED Triage Vitals  Enc Vitals Group     BP 07/24/21 1226 (!) 151/98     Pulse Rate 07/24/21 1226 69     Resp 07/24/21 1226 17     Temp 07/24/21 1226 98.3 F (36.8 C)     Temp Source 07/24/21 1226 Oral     SpO2 07/24/21 1226 100 %     Weight 07/24/21 1222 126 lb (57.2 kg)     Height 07/24/21 1222 '5\' 2"'  (1.575 m)     Head Circumference --      Peak Flow --      Pain Score 07/24/21 1222 6  Pain Loc --      Pain Edu? --      Excl. in [place]? --    No data found.  Updated Vital Signs BP (!) 151/98  (BP Location: Right Arm)   Pulse 69   Temp 98.3 F (36.8 C) (Oral)   Resp 17   Ht '5\' 2"'  (1.575 m)   Wt 126 lb (57.2 kg)   SpO2 100%   BMI 23.05 kg/m      Physical Exam Vitals and nursing note reviewed.  Constitutional:      General: She is not in acute distress.    Appearance: Normal appearance. She is not ill-appearing or toxic-appearing.  HENT:     Head: Normocephalic and atraumatic.  Eyes:     General: No scleral icterus.       Right eye: No discharge.        Left eye: No discharge.     Conjunctiva/sclera: Conjunctivae normal.  Cardiovascular:     Rate and Rhythm: Normal rate and regular rhythm.     Heart sounds: Normal heart sounds.  Pulmonary:     Effort: Pulmonary effort is normal. No respiratory distress.     Breath sounds: Normal breath sounds.  Musculoskeletal:     Cervical back: Neck supple.     Comments: KNEE: Moderate swelling of right anterior knee.  Full range of motion of the knee but does have pain beyond 90 degrees.  Tenderness to palpation of medial and lateral joint line.  BACK/BUTTOCK: Tenderness palpation at right buttock, SI joint.  No tenderness palpation of lower back.  Full range of motion of back.  Negative straight leg raise.  Skin:    General: Skin is dry.  Neurological:     General: No focal deficit present.     Mental Status: She is alert. Mental status is at baseline.     Motor: No weakness.     Gait: Gait normal.  Psychiatric:        Mood and Affect: Mood normal.        Behavior: Behavior normal.        Thought Content: Thought content normal.     UC Treatments / Results  Labs (all labs ordered are listed, but only abnormal results are displayed) Labs Reviewed - No data to display  EKG   Radiology No results found.  Procedures Procedures (including critical care time)  Medications Ordered in UC Medications - No data to display  Initial Impression / Assessment and Plan / UC Course  I have reviewed the triage vital signs  and the nursing notes.  Pertinent labs & imaging results that were available during my care of the patient were reviewed by me and considered in my medical decision making (see chart for details).  60 year old female presenting for right knee pain and swelling for the past couple of months as well as right buttocks pain.  Patient says pain is not improved with Tylenol and ibuprofen over-the-counter.  No red flag signs or symptoms.  Has history of knee surgeries.  Clinical presentation today is consistent with a flareup of underlying osteoarthritis of the right knee and suspected sciatica.  Treating patient at this time with prednisone since NSAIDs have not seemed to help.  I have also given her a short supply of Ultram for severe pain.  Advised her to keep appointment with orthopedics.  Reviewed ED precautions.  Final Clinical Impressions(s) / UC Diagnoses   Final diagnoses:  Chronic pain of right knee  Sciatica of right side     Discharge Instructions      -You are likely having a flareup of your chronic knee condition causing the swelling of the knee.  Keep the area elevated and ice it.  You can apply topical Voltaren gel.  Tylenol for pain relief if needed.  I have also sent Ultram for any severe pain. -You additionally appear to have sciatica.  The prednisone should help with this as well as the knee swelling. -Keep follow-up appointment with Ortho but if any severe acute worsening of symptoms before you can see then you can go to [facility] walk-in urgent care in [place] or the emergency department.     ED Prescriptions     Medication Sig Dispense Auth. Provider   predniSONE (DELTASONE) 10 MG tablet Take 6 tabs PO on day 1 and decrease by 1 tab daily until complete 21 tablet Laurene Footman B, PA-C   traMADol (ULTRAM) 50 MG tablet Take 1 tablet (50 mg total) by mouth every 6 (six) hours as needed. 12 tablet Danton Clap, PA-C      I have reviewed the PDMP during this  encounter.   Danton Clap, PA-C 07/24/21 1445

## 2021-08-03 NOTE — Progress Notes (Signed)
Review of Systems   Musculoskeletal: Positive for arthralgias.

## 2021-11-05 HISTORY — PX: REPLACEMENT TOTAL KNEE BILATERAL: SUR1225

## 2021-11-08 ENCOUNTER — Other Ambulatory Visit: Payer: Self-pay

## 2021-11-08 ENCOUNTER — Ambulatory Visit
Admission: RE | Admit: 2021-11-08 | Discharge: 2021-11-08 | Disposition: A | Discharge status: Home / Self Care | Payer: BLUE CROSS/BLUE SHIELD | Source: Ambulatory Visit | Attending: Orthopedic Surgery | Admitting: Orthopedic Surgery

## 2021-11-08 ENCOUNTER — Other Ambulatory Visit: Payer: Self-pay | Admitting: Orthopedic Surgery

## 2021-11-08 DIAGNOSIS — R2241 Localized swelling, mass and lump, right lower limb: Secondary | ICD-10-CM

## 2021-11-08 DIAGNOSIS — Z96651 Presence of right artificial knee joint: Secondary | ICD-10-CM | POA: Diagnosis present

## 2022-08-26 ENCOUNTER — Ambulatory Visit
Admission: RE | Admit: 2022-08-26 | Discharge: 2022-08-26 | Disposition: A | Discharge status: Home / Self Care | Payer: BLUE CROSS/BLUE SHIELD | Source: Ambulatory Visit | Attending: Emergency Medicine | Admitting: Emergency Medicine

## 2022-08-26 VITALS — BP 130/71 | HR 96 | Temp 97.9°F | Resp 14 | Ht 62.0 in | Wt 135.0 lb

## 2022-08-26 DIAGNOSIS — R197 Diarrhea, unspecified: Secondary | ICD-10-CM | POA: Diagnosis not present

## 2022-08-26 DIAGNOSIS — R1012 Left upper quadrant pain: Secondary | ICD-10-CM | POA: Diagnosis not present

## 2022-08-26 LAB — COMPREHENSIVE METABOLIC PANEL
ALT: 22 U/L (ref 0–44)
AST: 21 U/L (ref 15–41)
Albumin: 4.3 g/dL (ref 3.5–5.0)
Alkaline Phosphatase: 100 U/L (ref 38–126)
Anion gap: 5 (ref 5–15)
BUN: 20 mg/dL (ref 8–23)
CO2: 24 mmol/L (ref 22–32)
Calcium: 9.7 mg/dL (ref 8.9–10.3)
Chloride: 105 mmol/L (ref 98–111)
Creatinine, Ser: 0.79 mg/dL (ref 0.44–1.00)
GFR, Estimated: >60 mL/min (ref >60)
Glucose, Bld: 83 mg/dL (ref 70–99)
Potassium: 4.2 mmol/L (ref 3.5–5.1)
Sodium: 134 mmol/L — ABNORMAL LOW (ref 135–145)
Total Bilirubin: 0.6 mg/dL (ref 0.3–1.2)
Total Protein: 7.5 g/dL (ref 6.5–8.1)

## 2022-08-26 LAB — CBC WITH DIFFERENTIAL/PLATELET
Abs Immature Granulocytes: 0.03 K/uL (ref 0.00–0.07)
Basophils Absolute: 0.1 K/uL (ref 0.0–0.1)
Basophils Relative: 1 %
Eosinophils Absolute: 0.1 K/uL (ref 0.0–0.5)
Eosinophils Relative: 1 %
HCT: 37.9 % (ref 36.0–46.0)
Hemoglobin: 12.6 g/dL (ref 12.0–15.0)
Immature Granulocytes: 0 %
Lymphocytes Relative: 16 %
Lymphs Abs: 1.5 K/uL (ref 0.7–4.0)
MCH: 33.2 pg (ref 26.0–34.0)
MCHC: 33.2 g/dL (ref 30.0–36.0)
MCV: 99.7 fL (ref 80.0–100.0)
Monocytes Absolute: 0.5 K/uL (ref 0.1–1.0)
Monocytes Relative: 5 %
Neutro Abs: 7.5 K/uL (ref 1.7–7.7)
Neutrophils Relative %: 77 %
Platelets: 290 K/uL (ref 150–400)
RBC: 3.8 MIL/uL — ABNORMAL LOW (ref 3.87–5.11)
RDW: 13.2 % (ref 11.5–15.5)
WBC: 9.7 K/uL (ref 4.0–10.5)
nRBC: 0 % (ref 0.0–0.2)

## 2022-08-26 LAB — TSH: TSH: 0.443 u[IU]/mL (ref 0.350–4.500)

## 2022-08-26 LAB — LIPASE, BLOOD: Lipase: 46 U/L (ref 11–51)

## 2022-08-26 MED ORDER — FAMOTIDINE 20 MG PO TABS: 20.0000 mg | ORAL_TABLET | Freq: Two times a day (BID) | ORAL | 0 refills | Status: AC

## 2022-08-26 MED ORDER — PANTOPRAZOLE SODIUM 40 MG PO TBEC: 40.0000 mg | DELAYED_RELEASE_TABLET | Freq: Every day | ORAL | 0 refills | Status: AC

## 2022-08-26 MED ORDER — ONDANSETRON 4 MG PO TBDP: 4.0000 mg | ORAL_TABLET | Freq: Three times a day (TID) | ORAL | 0 refills | Status: AC | PRN

## 2022-08-26 NOTE — ED Triage Notes (Signed)
Patient reports upper stomach pain and loose stools that started in August.  Patient states that pain started last month.  Patient denies fevers.  Patient denies N/V.

## 2022-08-26 NOTE — Discharge Instructions (Addendum)
Try the Pepcid and Protonix.  You can also try some Pepto-Bismol in case this is an ulcer.  Zofran will slow down the diarrhea.  Your labs were normal.  Your TSH is pending.  I will contact you if and only if it is abnormal and we need to change management.  I have put in an urgent referral to gastroenterology for further evaluation.  Go to the ER for fevers above 100.4, worsening pain, decreased urine output, abdominal distention, or for other concerns.

## 2022-08-26 NOTE — ED Provider Notes (Signed)
HPI  SUBJECTIVE:  Julie Boyer is a 61 y.o. female who presents with 3 months of daily, stabbing, dull, intermittent, hours long substernal/left upper quadrant pain that wraps around her ribs, 3-7 episodes of watery, nonbloody diarrhea in a 24-hour period of time, belching.  She reports increased bowel noise before having a bowel movement.  She denies nausea, palpitations, shortness of breath, radiation of this pain of her neck, down her arm or through to her back.  There is no exertional or positional component to it.  She denies waterbrash, burning chest pain.  No fevers, unintentional weight loss, abdominal distention, cough, wheeze, shortness of breath, melena, hematochezia.  She is reporting increase in the frequency of night sweats.  She was stooling once a day/every other day before her symptoms started.  No change in her diet, antibiotic use, travel, raw or undercooked foods, change in medications preceding her symptoms.  She has not tried anything for this.  No alleviating factors.  Symptoms worse with fasting and sometimes with eating.  She has a past medical history of hypothyroidism, which is monitored by a specialist.  It was last checked 3 months ago right before the symptoms started.  She has been on meloxicam since January when she had a knee replacement.  She also has a history of osteoporosis, status post hysterectomy, oophorectomy.  No history of diabetes, hypertension, atrial fibrillation, mesenteric ischemia, hypercoagulability, ulcerative colitis, Crohn's, peptic ulcer disease, H. pylori infection, other abdominal surgeries.  No history of MI, hypercholesterolemia, coronary disease, smoking, PAD/PVD, CVA, BMI above 30.  She had a normal colonoscopy 3 years ago.  PCP: None.   Past Medical History:  Diagnosis Date   Arthritis    hands   Complication of anesthesia    sneezing after last colonoscopy   Endometriosis    uterus   Family history of breast cancer    pt is BRCA neg,  11/18 updated genetic testing letter sent; IBIS=11.8%   Genetic testing of female    BRCA neg   History of mammogram 08/31/2011; 03/18/15   neg; benign   History of Papanicolaou smear of cervix 06/22/11; 03/18/15   NEG; NEG   Numbness and tingling in hands    pt thinks may be Carpal Tunnel issues   Osteopenia 07/2015   spine - DR. MORAYATI   Thyroid disorder    Wears dentures    partial lower    Past Surgical History:  Procedure Laterality Date   ABDOMINAL HYSTERECTOMY  11/171992   tab, bso - BVD   COLONOSCOPY  05/21/2010   benign appearing polyp was found in the rectum   COLONOSCOPY WITH PROPOFOL N/A 05/21/2015   Procedure: COLONOSCOPY WITH PROPOFOL;  Surgeon: [name], MD;  Location: [place];  Service: Endoscopy;  Laterality: N/A;   COLONOSCOPY WITH PROPOFOL N/A 11/13/2018   Procedure: COLONOSCOPY WITH PROPOFOL;  Surgeon: [name], MD;  Location: [facility] ENDOSCOPY;  Service: Endoscopy;  Laterality: N/A;   KNEE SURGERY Right 03/2001; 03/2013; 2011   arthroscopic   POLYPECTOMY  05/21/2015   Procedure: POLYPECTOMY;  Surgeon: [name], MD;  Location: [place];  Service: Endoscopy;;   TUBAL LIGATION  10/01/1989    Family History  Problem Relation Age of Onset   Prostate cancer Father 50   Bone cancer Father    Cancer Father 31       KIDNEY   Hypertension Father    Breast cancer Sister 92       has had breast  cancer twice   Diabetes Mother        TYPE 2   Alzheimer's disease Mother    Cancer Brother 72       KIDNEY   Lung cancer Brother 46   Breast cancer Maternal Aunt 80    Social History   Tobacco Use   Smoking status: Every Day    Packs/day: 1.00    Years: 20.00    Total pack years: 20.00    Types: Cigarettes   Smokeless tobacco: Never  Vaping Use   Vaping Use: Never used  Substance Use Topics   Alcohol use: Yes    Alcohol/week: 6.0 standard drinks of alcohol    Types: 6 Shots of liquor per week    Comment: OCCASIONALLY   Drug use:  No    No current facility-administered medications for this encounter.  Current Outpatient Medications:    famotidine (PEPCID) 20 MG tablet, Take 1 tablet (20 mg total) by mouth 2 (two) times daily., Disp: 40 tablet, Rfl: 0   ondansetron (ZOFRAN-ODT) 4 MG disintegrating tablet, Take 1 tablet (4 mg total) by mouth every 8 (eight) hours as needed for nausea or vomiting., Disp: 20 tablet, Rfl: 0   pantoprazole (PROTONIX) 40 MG tablet, Take 1 tablet (40 mg total) by mouth daily., Disp: 30 tablet, Rfl: 0   SYNTHROID 100 MCG tablet, Take 110 mcg by mouth daily., Disp: , Rfl: 11   BIOTIN FORTE PO, Take by mouth daily., Disp: , Rfl:    cetirizine (ZYRTEC) 10 MG tablet, Take 1 tablet (10 mg total) by mouth daily., Disp: 30 tablet, Rfl: 0   Cyanocobalamin (VITAMIN B-12 PO), Take 1 tablet by mouth every 3 (three) days. , Disp: , Rfl:    D3-50 1.25 MG (50000 UT) capsule, TAKE 1 CAPSULE BY MOUTH WEEKLY, Disp: , Rfl:    estradiol (ESTRACE) 0.5 MG tablet, TAKE 1 TABLET BY MOUTH EVERY DAY, Disp: 90 tablet, Rfl: 4   Misc Natural Products (OSTEO BI-FLEX ADV DOUBLE ST PO), Take by mouth daily., Disp: , Rfl:   Allergies  Allergen Reactions   Azithromycin Other (See Comments)    Z-Pak: Red splotches     ROS  As noted in HPI.   Physical Exam  BP 130/71 (BP Location: Left Arm)   Pulse 96   Temp 97.9 F (36.6 C) (Oral)   Resp 14   Ht _0  (1.575 m)   Wt 61.2 kg   SpO2 98%   BMI 24.69 kg/m   Constitutional: Well developed, well nourished, no acute distress Eyes:  EOMI, conjunctiva normal bilaterally HENT: Normocephalic, atraumatic,mucus membranes moist Respiratory: Normal inspiratory effort, lungs clear bilaterally.  No chest wall tenderness. Cardiovascular: Normal rate, no murmurs, rubs, gallops GI: nondistended throughout all quadrants, soft, nontender, no guarding, rebound.  Negative Murphy.  Active bowel sounds. Back: No CVAT skin: No rash, skin intact Musculoskeletal: no  deformities Neurologic: Alert & oriented x 3, no focal neuro deficits Psychiatric: Speech and behavior appropriate   ED Course   Medications - No data to display  Orders Placed This Encounter  Procedures   Comprehensive metabolic panel    Standing Status:   Standing    Number of Occurrences:   1   Lipase, blood    Standing Status:   Standing    Number of Occurrences:   1   CBC with Differential    Standing Status:   Standing    Number of Occurrences:   1   TSH  Standing Status:   Standing    Number of Occurrences:   1   Ambulatory referral to Gastroenterology    Referral Priority:   Urgent    Referral Type:   Consultation    Referral Reason:   Specialty Services Required    Number of Visits Requested:   1    Results for orders placed or performed during the hospital encounter of 08/26/22 (from the past 24 hour(s))  Comprehensive metabolic panel     Status: Abnormal   Collection Time: 08/26/22 11:06 AM  Result Value Ref Range   Sodium 134 (L) 135 - 145 mmol/L   Potassium 4.2 3.5 - 5.1 mmol/L   Chloride 105 98 - 111 mmol/L   CO2 24 22 - 32 mmol/L   Glucose, Bld 83 70 - 99 mg/dL   BUN 20 8 - 23 mg/dL   Creatinine, Ser 0.79 0.44 - 1.00 mg/dL   Calcium 9.7 8.9 - 10.3 mg/dL   Total Protein 7.5 6.5 - 8.1 g/dL   Albumin 4.3 3.5 - 5.0 g/dL   AST 21 15 - 41 U/L   ALT 22 0 - 44 U/L   Alkaline Phosphatase 100 38 - 126 U/L   Total Bilirubin 0.6 0.3 - 1.2 mg/dL   GFR, Estimated >60 >60 mL/min   Anion gap 5 5 - 15  Lipase, blood     Status: None   Collection Time: 08/26/22 11:06 AM  Result Value Ref Range   Lipase 46 11 - 51 U/L  CBC with Differential     Status: Abnormal   Collection Time: 08/26/22 11:06 AM  Result Value Ref Range   WBC 9.7 4.0 - 10.5 K/uL   RBC 3.80 (L) 3.87 - 5.11 MIL/uL   Hemoglobin 12.6 12.0 - 15.0 g/dL   HCT 37.9 36.0 - 46.0 %   MCV 99.7 80.0 - 100.0 fL   MCH 33.2 26.0 - 34.0 pg   MCHC 33.2 30.0 - 36.0 g/dL   RDW 13.2 11.5 - 15.5 %    Platelets 290 150 - 400 K/uL   nRBC 0.0 0.0 - 0.2 %   Neutrophils Relative % 77 %   Neutro Abs 7.5 1.7 - 7.7 K/uL   Lymphocytes Relative 16 %   Lymphs Abs 1.5 0.7 - 4.0 K/uL   Monocytes Relative 5 %   Monocytes Absolute 0.5 0.1 - 1.0 K/uL   Eosinophils Relative 1 %   Eosinophils Absolute 0.1 0.0 - 0.5 K/uL   Basophils Relative 1 %   Basophils Absolute 0.1 0.0 - 0.1 K/uL   Immature Granulocytes 0 %   Abs Immature Granulocytes 0.03 0.00 - 0.07 K/uL  TSH     Status: None   Collection Time: 08/26/22 11:06 AM  Result Value Ref Range   TSH 0.443 0.350 - 4.500 uIU/mL   No results found.  ED Clinical Impression  1. Left upper quadrant abdominal pain   2. Diarrhea, unspecified type      ED Assessment/Plan     Pt abd exam is benign, no peritoneal signs. No evidence of surgical abd. checking CBC, CMP, lipase, TSH.  Doubt infection as it has been 3 months.  Doubt ACS given the accompanying diarrhea.  Doubt SBO, mesenteric ischemia, appendicitis, hepatitis, cholecystitis, pancreatitis, or perforated viscus.  Patient is status post hysterectomy and oophorectomy.  Doubt GYN infection.  Labs reviewed.  Lipase, CBC, CMP normal.  TSH pending.  We will contact patient if this is abnormal.  TSH normal.  Patient  with abdominal pain and diarrhea for 3 months.  She is stable.  Unable to identify specific cause today.  Suspect H. pylori disease/peptic ulcer disease.  Will refer to GI for further evaluation.  In the meantime, home with Pepcid, Protonix, Pepto-Bismol.  Zofran for its constipating properties.  Will have staff set patient up with a PCP prior to discharge.  Discussed labs, MDM, treatment plan, and plan for follow-up with patient. Discussed sn/sx that should prompt return to the ED. patient agrees with plan.   Meds ordered this encounter  Medications   pantoprazole (PROTONIX) 40 MG tablet    Sig: Take 1 tablet (40 mg total) by mouth daily.    Dispense:  30 tablet    Refill:  0    famotidine (PEPCID) 20 MG tablet    Sig: Take 1 tablet (20 mg total) by mouth 2 (two) times daily.    Dispense:  40 tablet    Refill:  0   ondansetron (ZOFRAN-ODT) 4 MG disintegrating tablet    Sig: Take 1 tablet (4 mg total) by mouth every 8 (eight) hours as needed for nausea or vomiting.    Dispense:  20 tablet    Refill:  0      *This clinic note was created using [employer]. Therefore, there may be occasional mistakes despite careful proofreading.  ?    Melynda Ripple, MD 08/27/22 [phone]

## 2023-10-27 IMAGING — US US EXTREM LOW VENOUS*R*
1 series · 14 of 24 positions shown · non-contrast
Comparison: None.

CLINICAL DATA: Pain and swelling

EXAM:
Right LOWER EXTREMITY VENOUS DOPPLER ULTRASOUND
TECHNIQUE: Gray-scale sonography with compression, as well as color and duplex
ultrasound, were performed to evaluate the deep venous system(s)
from the level of the common femoral vein through the popliteal and
proximal calf veins.

[Series 1: us venous img lower uni right (dvt) · portal-venous · 14 of 34 slices shown]
[im 1/34]
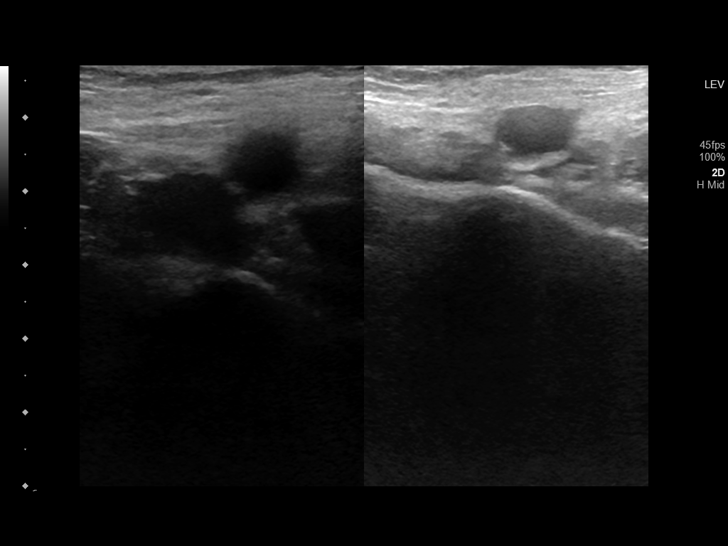
[im 3/34]
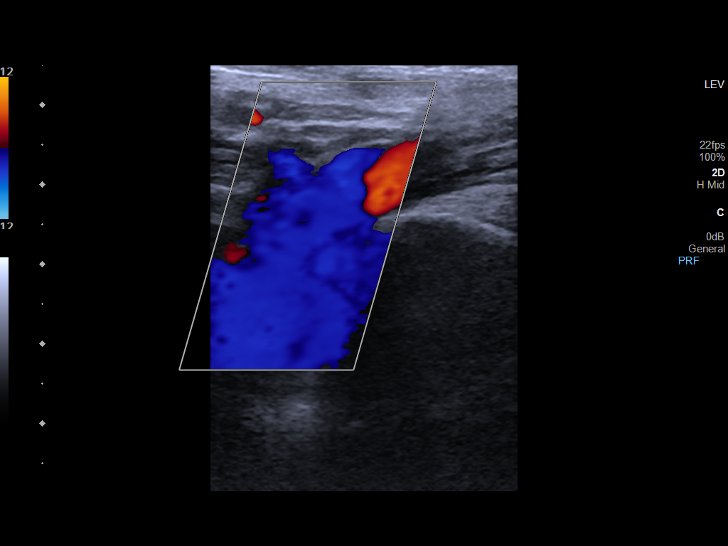
[im 6/34]
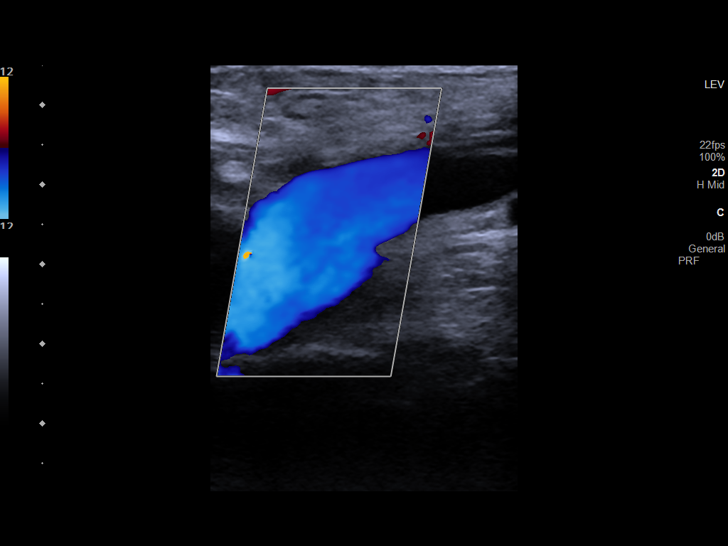
[im 9/34]
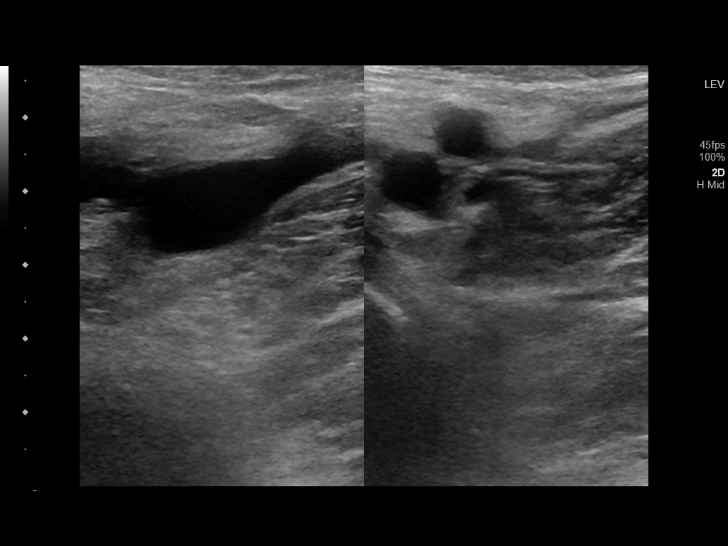
[im 11/34]
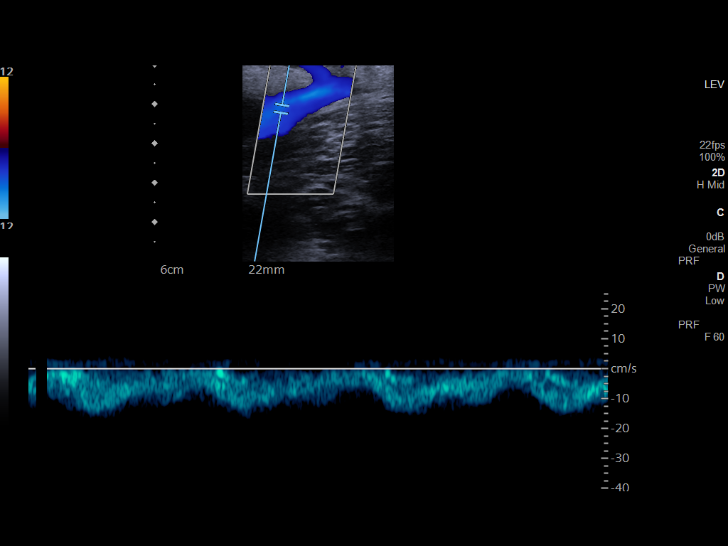
[im 13/34]
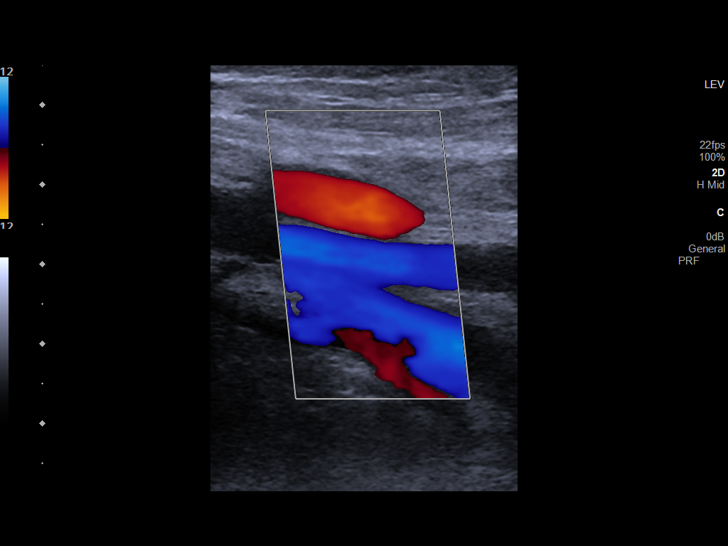
[im 16/34]
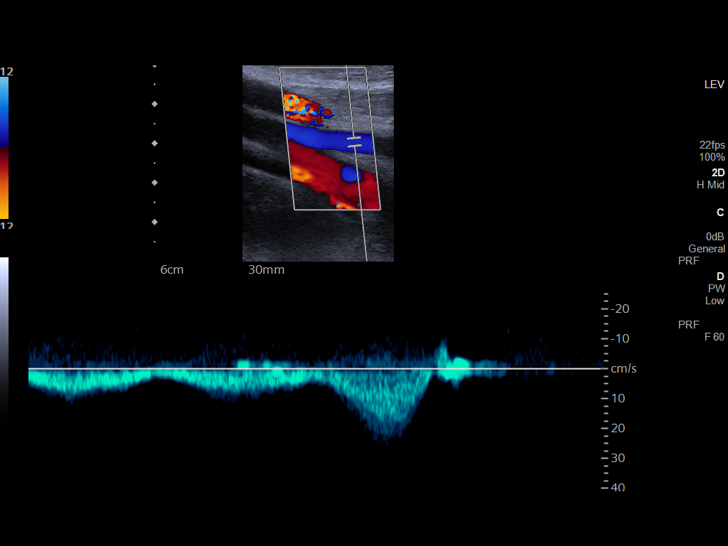
[im 18/34]
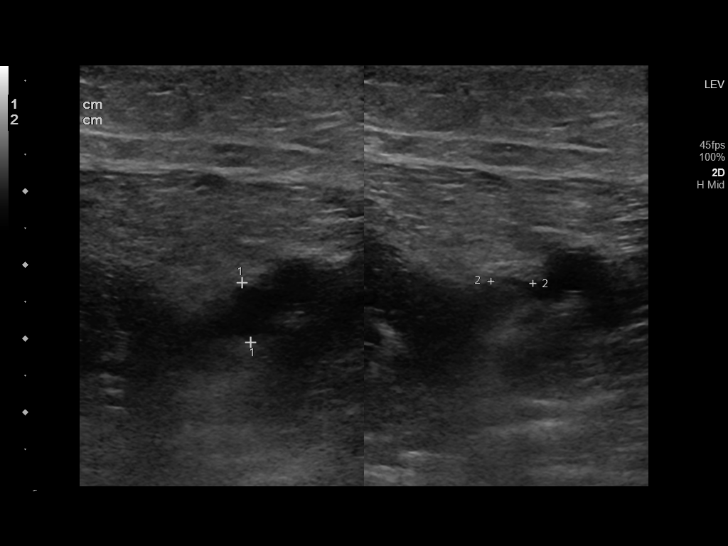
[im 21/34]
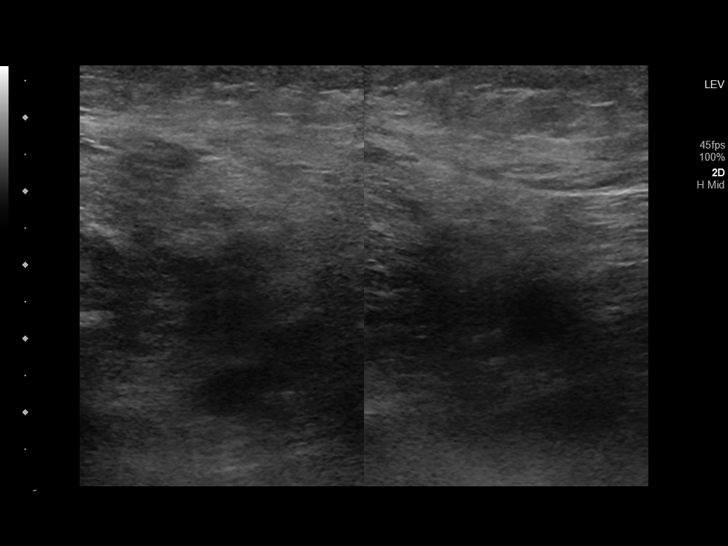
[im 23/34]
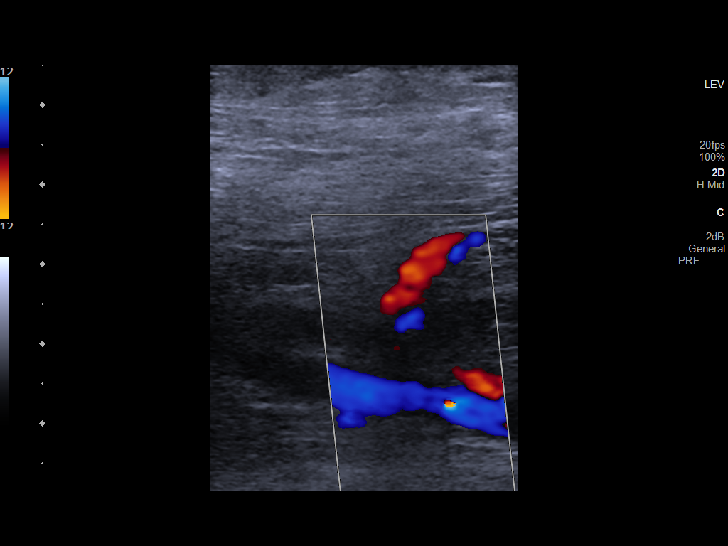
[im 26/34]
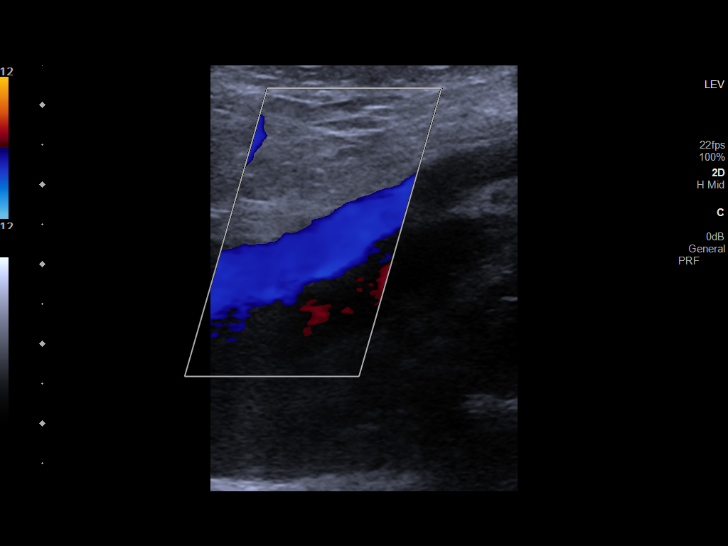
[im 28/34]
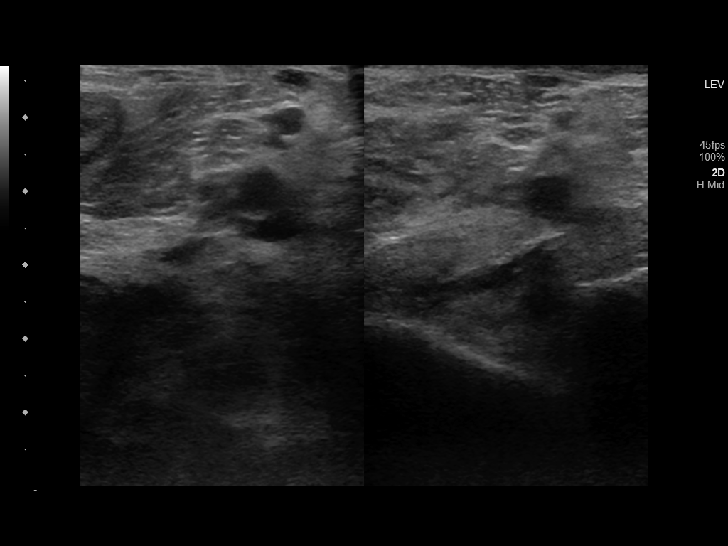
[im 31/34]
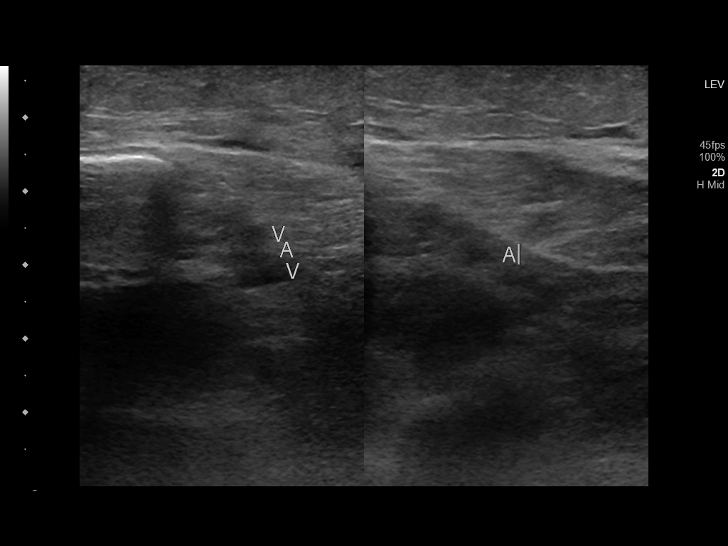
[im 34/34]
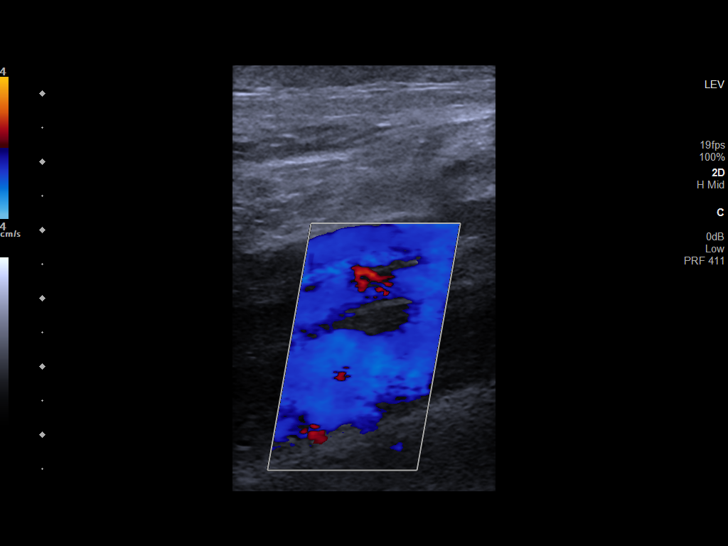

[14 of 24 positions shown; findings below may reference images not displayed]

FINDINGS: VENOUS

Normal compressibility of the common femoral, superficial femoral,
and popliteal veins, as well as the visualized calf veins.
Visualized portions of profunda femoral vein and great saphenous
vein unremarkable. No filling defects to suggest DVT on grayscale or
color Doppler imaging. Doppler waveforms show normal direction of
venous flow, normal respiratory plasticity and response to
augmentation.

Limited views of the contralateral common femoral vein are
unremarkable.

OTHER

None.

Limitations: none
IMPRESSION: There is no evidence of deep venous thrombosis in the right lower
extremity.

## 2024-04-22 NOTE — Progress Notes (Unsigned)
 ANNUAL PREVENTATIVE CARE GYNECOLOGY  ENCOUNTER NOTE  SUBJECTIVE:       Julie Boyer is a 63 y.o. G88P2012 female here for a routine annual gynecologic exam. The patient {is/is not/has never been:13135} sexually active. The patient {is/is not:13135} taking hormone replacement therapy. {post-men bleed:13152::Patient denies post-menopausal vaginal bleeding.} Family history of breast, uterine, ovarian cancer: {yes/no:311178}. The patient wears seatbelts: {yes/no:311178}. The patient participates in regular exercise: {yes/no/not asked:9010}. Has the patient ever been transfused or tattooed?: {yes/no/not asked:9010}. The patient reports that there {is/is not:9024} domestic violence in her life. Has the patient completed the Gardasil vaccine? {yes/no:311178}.  Current complaints: 1.  ***    Gynecologic History No LMP recorded. Patient has had a hysterectomy. Contraception: {method:5051} Last Pap: ***. Results were: {norm/abn:16337} History of abnormal pap: *** History of STIs: *** Last Mammogram: ***. Results were: {norm/abn:16337} Last Colonoscopy:  Last Dexa Scan:   PHQ-2:     12/07/2016    9:54 AM 04/02/2015    1:44 PM  Depression screen PHQ 2/9  Decreased Interest 2 3  Down, Depressed, Hopeless 2 3  PHQ - 2 Score 4 6  Altered sleeping 1 3  Tired, decreased energy 2 3  Change in appetite  1  Feeling bad or failure about yourself  3 1  Trouble concentrating 0 0  Moving slowly or fidgety/restless 0 0  Suicidal thoughts 0  0   PHQ-9 Score 10 14  Difficult doing work/chores Somewhat difficult Not difficult at all     Data saved with a previous flowsheet row definition    Obstetric History OB History  Gravida Para Term Preterm AB Living  3 2 2  1 2   SAB IAB Ectopic Multiple Live Births  1    2    # Outcome Date GA Lbr Len/2nd Weight Sex Type Anes PTL Lv  3 Term 09/30/89   7 lb 14 oz (3.572 kg) F Vag-Spont   LIV  2 Term 11/25/84   6 lb 12 oz (3.062 kg) M Vag-Spont   LIV   1 SAB             Past Medical History:  Diagnosis Date   Arthritis    hands   Complication of anesthesia    sneezing after last colonoscopy   Endometriosis    uterus   Family history of breast cancer    pt is BRCA neg, 11/18 updated genetic testing letter sent; IBIS=11.8%   Genetic testing of female    BRCA neg   History of mammogram 08/31/2011; 03/18/15   neg; benign   History of Papanicolaou smear of cervix 06/22/11; 03/18/15   NEG; NEG   Numbness and tingling in hands    pt thinks may be Carpal Tunnel issues   Osteopenia 07/2015   spine - DR. MORAYATI   Thyroid disorder    Wears dentures    partial lower    Family History  Problem Relation Age of Onset   Prostate cancer Father 67   Bone cancer Father    Cancer Father 48       KIDNEY   Hypertension Father    Breast cancer Sister 75       has had breast cancer twice   Diabetes Mother        TYPE 2   Alzheimer's disease Mother    Cancer Brother 37       KIDNEY   Lung cancer Brother 69   Breast cancer Maternal Aunt 36  Past Surgical History:  Procedure Laterality Date   ABDOMINAL HYSTERECTOMY  11/171992   tab, bso - BVD   COLONOSCOPY  05/21/2010   benign appearing polyp was found in the rectum   COLONOSCOPY WITH PROPOFOL  N/A 05/21/2015   Procedure: COLONOSCOPY WITH PROPOFOL ;  Surgeon: Rogelia Copping, MD;  Location: [facility];  Service: Endoscopy;  Laterality: N/A;   COLONOSCOPY WITH PROPOFOL  N/A 11/13/2018   Procedure: COLONOSCOPY WITH PROPOFOL ;  Surgeon: Copping Rogelia, MD;  Location: [facility];  Service: Endoscopy;  Laterality: N/A;   KNEE SURGERY Right 03/2001; 03/2013; 2011   arthroscopic   POLYPECTOMY  05/21/2015   Procedure: POLYPECTOMY;  Surgeon: Rogelia Copping, MD;  Location: [facility];  Service: Endoscopy;;   TUBAL LIGATION  10/01/1989    Social History   Socioeconomic History   Marital status: Single    Spouse name: Not on file   Number of children: 3   Years of education: Not  on file   Highest education level: Not on file  Occupational History   Occupation: BUSINESS  Tobacco Use   Smoking status: Every Day    Current packs/day: 1.00    Average packs/day: 1 pack/day for 20.0 years (20.0 ttl pk-yrs)    Types: Cigarettes   Smokeless tobacco: Never  Vaping Use   Vaping status: Never Used  Substance and Sexual Activity   Alcohol use: Yes    Alcohol/week: 6.0 standard drinks of alcohol    Types: 6 Shots of liquor per week    Comment: OCCASIONALLY   Drug use: No   Sexual activity: Yes    Birth control/protection: Surgical  Other Topics Concern   Not on file  Social History Narrative   Not on file   Social Drivers of Health   Financial Resource Strain: Not on file  Food Insecurity: Not on file  Transportation Needs: Not on file  Physical Activity: Not on file  Stress: Not on file  Social Connections: Not on file  Intimate Partner Violence: Not on file    Current Outpatient Medications on File Prior to Visit  Medication Sig Dispense Refill   BIOTIN FORTE PO Take by mouth daily.     cetirizine (ZYRTEC) 10 MG tablet Take 1 tablet (10 mg total) by mouth daily. 30 tablet 0   Cyanocobalamin (VITAMIN B-12 PO) Take 1 tablet by mouth every 3 (three) days.      D3-50 1.25 MG (50000 UT) capsule TAKE 1 CAPSULE BY MOUTH WEEKLY     estradiol  (ESTRACE ) 0.5 MG tablet TAKE 1 TABLET BY MOUTH EVERY DAY 90 tablet 4   famotidine  (PEPCID ) 20 MG tablet Take 1 tablet (20 mg total) by mouth 2 (two) times daily. 40 tablet 0   Misc Natural Products (OSTEO BI-FLEX ADV DOUBLE ST PO) Take by mouth daily.     ondansetron  (ZOFRAN -ODT) 4 MG disintegrating tablet Take 1 tablet (4 mg total) by mouth every 8 (eight) hours as needed for nausea or vomiting. 20 tablet 0   pantoprazole  (PROTONIX ) 40 MG tablet Take 1 tablet (40 mg total) by mouth daily. 30 tablet 0   SYNTHROID 100 MCG tablet Take 110 mcg by mouth daily.  11   No current facility-administered medications on file prior to  visit.    Allergies  Allergen Reactions   Azithromycin Other (See Comments)    Z-Pak: Red splotches     Review of Systems ROS Review of Systems - General ROS: negative for - chills, fatigue, fever, hot flashes, night sweats,  weight gain or weight loss Psychological ROS: negative for - anxiety, decreased libido, depression, mood swings, physical abuse or sexual abuse Ophthalmic ROS: negative for - blurry vision, eye pain or loss of vision ENT ROS: negative for - headaches, hearing change, visual changes or vocal changes Allergy and Immunology ROS: negative for - hives, itchy/watery eyes or seasonal allergies Hematological and Lymphatic ROS: negative for - bleeding problems, bruising, swollen lymph nodes or weight loss Endocrine ROS: negative for - galactorrhea, hair pattern changes, hot flashes, malaise/lethargy, mood swings, palpitations, polydipsia/polyuria, skin changes, temperature intolerance or unexpected weight changes Breast ROS: negative for - new or changing breast lumps or nipple discharge Respiratory ROS: negative for - cough or shortness of breath Cardiovascular ROS: negative for - chest pain, irregular heartbeat, palpitations or shortness of breath Gastrointestinal ROS: no abdominal pain, change in bowel habits, or black or bloody stools Genito-Urinary ROS: no dysuria, trouble voiding, or hematuria Musculoskeletal ROS: negative for - joint pain or joint stiffness Neurological ROS: negative for - bowel and bladder control changes Dermatological ROS: negative for rash and skin lesion changes   OBJECTIVE:   There were no vitals taken for this visit.  CONSTITUTIONAL: Well-developed, well-nourished female in no acute distress.  PSYCHIATRIC: Normal mood and affect. Normal behavior. Normal judgment and thought content. NEUROLGIC: Alert and oriented to person, place, and time. Normal muscle tone coordination. No cranial nerve deficit noted. HENT:  Normocephalic, atraumatic,  External right and left ear normal. Oropharynx is clear and moist EYES: Conjunctivae and EOM are normal. No scleral icterus.  NECK: Normal range of motion, supple, no masses.  Normal thyroid.  SKIN: Skin is warm and dry. No rash noted. Not diaphoretic. No erythema. No pallor. CARDIOVASCULAR: Normal heart rate noted, regular rhythm, no murmur. RESPIRATORY: Clear to auscultation bilaterally. Effort and breath sounds normal, no problems with respiration noted. BREASTS: Symmetric in size. No masses, skin changes, nipple drainage, or lymphadenopathy. ABDOMEN: Soft, normal bowel sounds, no distention noted.  No tenderness, rebound or guarding.  PELVIC:  Bladder {:311640}  Urethra: {:311719}  Vulva: {:311722}  Vagina: {:311643}  Cervix: {:311644}  Uterus: {:311718}  Adnexa: {:311645}  RV: {Blank multiple:19196::External Exam NormaI,No Rectal Masses,Normal Sphincter tone}  MUSCULOSKELETAL: Normal range of motion. No tenderness.  No cyanosis, clubbing, or edema.  2+ distal pulses. LYMPHATIC: No Axillary, Supraclavicular, or Inguinal Adenopathy.  Labs: Lab Results  Component Value Date   WBC 9.7 08/26/2022   HGB 12.6 08/26/2022   HCT 37.9 08/26/2022   MCV 99.7 08/26/2022   PLT 290 08/26/2022    Lab Results  Component Value Date   CREATININE 0.79 08/26/2022   BUN 20 08/26/2022   NA 134 (L) 08/26/2022   K 4.2 08/26/2022   CL 105 08/26/2022   CO2 24 08/26/2022    Lab Results  Component Value Date   ALT 22 08/26/2022   AST 21 08/26/2022   ALKPHOS 100 08/26/2022   BILITOT 0.6 08/26/2022    Lab Results  Component Value Date   CHOL 183 04/08/2016   HDL 86 04/08/2016   LDLCALC 86 04/08/2016   TRIG 56 04/08/2016   CHOLHDL 2.1 04/08/2016    Lab Results  Component Value Date   TSH 0.443 08/26/2022    Lab Results  Component Value Date   HGBA1C 5.3 03/09/2018     ASSESSMENT:   No diagnosis found.   PLAN:   Julie Boyer is a 63 y.o. [phone] female here today  for her annual exam, doing well.  Pap: done with cotesting today Mammogram: ordered***due *** Colon: PCP *** ordered colonoscopy***Cologuard -OR- due *** Labs: ***A1C, CMP, HepC, Lipid panel, Vit D, TSH PHQ-2 = ***, discussed coping techniques; RTC if worsens or develops concern Contraception: *** Healthy lifestyle modifications discussed: multivitamin, diet, exercise, sunscreen, tobacco and alcohol use. Emphasized importance of regular physical activity.  Calcium and Vit D recommendation reviewed.  All questions answered to patient's satisfaction.   Follow up 1 yr for annual, sooner prn.    Estil Mangle, DO [facility]

## 2024-04-22 NOTE — Patient Instructions (Signed)
 Preventive Care 16-64 Years Old, Female  Preventive care refers to lifestyle choices and visits with your health care provider that can promote health and wellness. Preventive care visits are also called wellness exams.  What can I expect for my preventive care visit?  Counseling  Your health care provider may ask you questions about your:  Medical history, including:  Past medical problems.  Family medical history.  Pregnancy history.  Current health, including:  Menstrual cycle.  Method of birth control.  Emotional well-being.  Home life and relationship well-being.  Sexual activity and sexual health.  Lifestyle, including:  Alcohol, nicotine or tobacco, and drug use.  Access to firearms.  Diet, exercise, and sleep habits.  Work and work Astronomer.  Sunscreen use.  Safety issues such as seatbelt and bike helmet use.  Physical exam  Your health care provider will check your:  Height and weight. These may be used to calculate your BMI (body mass index). BMI is a measurement that tells if you are at a healthy weight.  Waist circumference. This measures the distance around your waistline. This measurement also tells if you are at a healthy weight and may help predict your risk of certain diseases, such as type 2 diabetes and high blood pressure.  Heart rate and blood pressure.  Body temperature.  Skin for abnormal spots.  What immunizations do I need?    Vaccines are usually given at various ages, according to a schedule. Your health care provider will recommend vaccines for you based on your age, medical history, and lifestyle or other factors, such as travel or where you work.  What tests do I need?  Screening  Your health care provider may recommend screening tests for certain conditions. This may include:  Lipid and cholesterol levels.  Diabetes screening. This is done by checking your blood sugar (glucose) after you have not eaten for a while (fasting).  Pelvic exam and Pap test.  Hepatitis B test.  Hepatitis C  test.  HIV (human immunodeficiency virus) test.  STI (sexually transmitted infection) testing, if you are at risk.  Lung cancer screening.  Colorectal cancer screening.  Mammogram. Talk with your health care provider about when you should start having regular mammograms. This may depend on whether you have a family history of breast cancer.  BRCA-related cancer screening. This may be done if you have a family history of breast, ovarian, tubal, or peritoneal cancers.  Bone density scan. This is done to screen for osteoporosis.  Talk with your health care provider about your test results, treatment options, and if necessary, the need for more tests.  Follow these instructions at home:  Eating and drinking    Eat a diet that includes fresh fruits and vegetables, whole grains, lean protein, and low-fat dairy products.  Take vitamin and mineral supplements as recommended by your health care provider.  Do not drink alcohol if:  Your health care provider tells you not to drink.  You are pregnant, may be pregnant, or are planning to become pregnant.  If you drink alcohol:  Limit how much you have to 0-1 drink a day.  Know how much alcohol is in your drink. In the [place] one drink equals one 12 oz bottle of beer (355 mL), one 5 oz glass of wine (148 mL), or one 1 oz glass of hard liquor (44 mL).  Lifestyle  Brush your teeth every morning and night with fluoride toothpaste. Floss one time each day.  Exercise for at least  30 minutes 5 or more days each week.  Do not use any products that contain nicotine or tobacco. These products include cigarettes, chewing tobacco, and vaping devices, such as e-cigarettes. If you need help quitting, ask your health care provider.  Do not use drugs.  If you are sexually active, practice safe sex. Use a condom or other form of protection to prevent STIs.  If you do not wish to become pregnant, use a form of birth control. If you plan to become pregnant, see your health care provider for a  prepregnancy visit.  Take aspirin only as told by your health care provider. Make sure that you understand how much to take and what form to take. Work with your health care provider to find out whether it is safe and beneficial for you to take aspirin daily.  Find healthy ways to manage stress, such as:  Meditation, yoga, or listening to music.  Journaling.  Talking to a trusted person.  Spending time with friends and family.  Minimize exposure to UV radiation to reduce your risk of skin cancer.  Safety  Always wear your seat belt while driving or riding in a vehicle.  Do not drive:  If you have been drinking alcohol. Do not ride with someone who has been drinking.  When you are tired or distracted.  While texting.  If you have been using any mind-altering substances or drugs.  Wear a helmet and other protective equipment during sports activities.  If you have firearms in your house, make sure you follow all gun safety procedures.  Seek help if you have been physically or sexually abused.  What's next?  Visit your health care provider once a year for an annual wellness visit.  Ask your health care provider how often you should have your eyes and teeth checked.  Stay up to date on all vaccines.  This information is not intended to replace advice given to you by your health care provider. Make sure you discuss any questions you have with your health care provider.  Document Revised: 03/24/2021 Document Reviewed: 03/24/2021  Elsevier Patient Education  2024 [employer].

## 2024-04-24 ENCOUNTER — Ambulatory Visit (INDEPENDENT_AMBULATORY_CARE_PROVIDER_SITE_OTHER): Admitting: Obstetrics

## 2024-04-24 ENCOUNTER — Encounter: Payer: Self-pay | Admitting: Obstetrics

## 2024-04-24 ENCOUNTER — Other Ambulatory Visit: Payer: Self-pay | Admitting: Obstetrics

## 2024-04-24 VITALS — BP 124/85 | HR 75 | Ht 63.0 in | Wt 127.0 lb

## 2024-04-24 DIAGNOSIS — Z131 Encounter for screening for diabetes mellitus: Secondary | ICD-10-CM

## 2024-04-24 DIAGNOSIS — Z01419 Encounter for gynecological examination (general) (routine) without abnormal findings: Secondary | ICD-10-CM | POA: Diagnosis not present

## 2024-04-24 DIAGNOSIS — Z1321 Encounter for screening for nutritional disorder: Secondary | ICD-10-CM

## 2024-04-24 DIAGNOSIS — Z13220 Encounter for screening for lipoid disorders: Secondary | ICD-10-CM

## 2024-04-24 DIAGNOSIS — Z90710 Acquired absence of both cervix and uterus: Secondary | ICD-10-CM

## 2024-04-24 DIAGNOSIS — F17210 Nicotine dependence, cigarettes, uncomplicated: Secondary | ICD-10-CM

## 2024-04-24 DIAGNOSIS — Z124 Encounter for screening for malignant neoplasm of cervix: Secondary | ICD-10-CM

## 2024-04-24 DIAGNOSIS — Z1159 Encounter for screening for other viral diseases: Secondary | ICD-10-CM

## 2024-04-24 DIAGNOSIS — Z1231 Encounter for screening mammogram for malignant neoplasm of breast: Secondary | ICD-10-CM

## 2024-04-25 LAB — HEMOGLOBIN A1C
Est. average glucose Bld gHb Est-mCnc: 103 mg/dL
Hgb A1c MFr Bld: 5.2 % (ref 4.8–5.6)

## 2024-04-25 LAB — HEPATITIS C ANTIBODY: Hep C Virus Ab: NONREACTIVE

## 2024-04-25 LAB — LIPID PANEL
Chol/HDL Ratio: 3 ratio (ref 0.0–4.4)
Cholesterol, Total: 182 mg/dL (ref 100–199)
HDL: 60 mg/dL (ref >39)
LDL Chol Calc (NIH): 103 mg/dL — ABNORMAL HIGH (ref 0–99)
Triglycerides: 104 mg/dL (ref 0–149)
VLDL Cholesterol Cal: 19 mg/dL (ref 5–40)

## 2024-04-25 LAB — VITAMIN D 25 HYDROXY (VIT D DEFICIENCY, FRACTURES): Vit D, 25-Hydroxy: 107 ng/mL — ABNORMAL HIGH (ref 30.0–100.0)

## 2024-05-06 ENCOUNTER — Other Ambulatory Visit: Payer: Self-pay | Admitting: Obstetrics

## 2024-05-06 DIAGNOSIS — Z1231 Encounter for screening mammogram for malignant neoplasm of breast: Secondary | ICD-10-CM

## 2024-05-22 ENCOUNTER — Ambulatory Visit
Admission: RE | Admit: 2024-05-22 | Discharge: 2024-05-22 | Disposition: A | Discharge status: Home / Self Care | Source: Ambulatory Visit | Attending: Obstetrics | Admitting: Obstetrics

## 2024-05-22 DIAGNOSIS — Z1231 Encounter for screening mammogram for malignant neoplasm of breast: Secondary | ICD-10-CM | POA: Insufficient documentation

## 2024-08-07 ENCOUNTER — Ambulatory Visit: Admitting: Nurse Practitioner
